# Patient Record
Sex: Female | Born: 1990 | Race: White | Hispanic: No | Marital: Single | Smoking: Current some day smoker
Health system: Southern US, Community
[De-identification: ages and names within clinical notes are randomized; demographics above are authoritative.]

## PROBLEM LIST (undated history)

## (undated) DIAGNOSIS — Z789 Other specified health status: Secondary | ICD-10-CM

## (undated) DIAGNOSIS — M5126 Other intervertebral disc displacement, lumbar region: Secondary | ICD-10-CM

## (undated) HISTORY — PX: NO PAST SURGERIES: SHX2092

---
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
## 2012-04-02 ENCOUNTER — Ambulatory Visit: Payer: BC Managed Care – HMO | Admitting: Family Medicine

## 2017-12-26 ENCOUNTER — Other Ambulatory Visit: Payer: Self-pay

## 2017-12-26 ENCOUNTER — Emergency Department (INDEPENDENT_AMBULATORY_CARE_PROVIDER_SITE_OTHER)
Admission: EM | Admit: 2017-12-26 | Discharge: 2017-12-26 | Disposition: A | Discharge status: Home / Self Care | Payer: BLUE CROSS/BLUE SHIELD | Source: Home / Self Care | Attending: Family Medicine | Admitting: Family Medicine

## 2017-12-26 DIAGNOSIS — R69 Illness, unspecified: Secondary | ICD-10-CM

## 2017-12-26 DIAGNOSIS — J111 Influenza due to unidentified influenza virus with other respiratory manifestations: Secondary | ICD-10-CM

## 2017-12-26 MED ORDER — AZITHROMYCIN 250 MG PO TABS: ORAL_TABLET | ORAL | 0 refills | Status: DC

## 2017-12-26 MED ORDER — BENZONATATE 200 MG PO CAPS: ORAL_CAPSULE | ORAL | 0 refills | Status: DC

## 2017-12-26 NOTE — Discharge Instructions (Addendum)
Take plain guaifenesin (1200mg  extended release tabs such as Mucinex) twice daily, with plenty of water, for cough and congestion.    Try warm salt water gargles for sore throat.  Stop all antihistamines for now, and other non-prescription cough/cold preparations. May take Ibuprofen 200mg , 4 tabs every 8 hours with food for chest/sternum discomfort, body aches, fever, etc. If needed, may take Delsym Cough Suppressant with Tessalon at bedtime for nighttime cough.

## 2017-12-26 NOTE — ED Provider Notes (Signed)
[facility]    CSN: [id number] Arrival date & time: 12/26/17  1659     History   Chief Complaint Chief Complaint  Patient presents with  . Chills    Flu like sxs    HPI Heidi Lozano is a 27 y.o. female.   Complains of 5 day history flu-like illness including myalgias, headache, fever/chills, fatigue, and cough.  She denies nasal congestion and sore throat.  Cough is non-productive and worse at night.  She complains of pleuritic pain in her anterior/inferior ribs and mild shortness of breath.  She has not had a flu shot this season.    The history is provided by the patient.    History reviewed. No pertinent past medical history.  There are no active problems to display for this patient.   History reviewed. No pertinent surgical history.  OB History   None      Home Medications    Prior to Admission medications   Medication Sig Start Date End Date Taking? Authorizing Provider  azithromycin (ZITHROMAX Z-PAK) 250 MG tablet Take 2 tabs today; then begin one tab once daily for 4 more days. 12/26/17   Lattie HawBeese,  A, MD  benzonatate (TESSALON) 200 MG capsule Take one cap by mouth at bedtime as needed for cough.  May repeat in 4 to 6 hours 12/26/17   Lattie HawBeese,  A, MD    Family History History reviewed. No pertinent family history.  Social History Social History   Tobacco Use  . Smoking status: Never Smoker  . Smokeless tobacco: Never Used  Substance Use Topics  . Alcohol use: Never    Frequency: Never  . Drug use: Never     Allergies   Patient has no known allergies.   Review of Systems Review of Systems No sore throat + cough + pleuritic pain anterior/inferior chest No wheezing No nasal congestion No post-nasal drainage No sinus pain/pressure No itchy/red eyes No earache No hemoptysis + SOB + fever, + chills No nausea No vomiting No abdominal pain No diarrhea No urinary symptoms No skin rash + fatigue + myalgias +  headache Used OTC meds without relief  Physical Exam Triage Vital Signs ED Triage Vitals [12/26/17 1717]  Enc Vitals Group     BP 109/71     Pulse Rate 86     Resp      Temp 98.5 F (36.9 C)     Temp Source Oral     SpO2 98 %     Weight 121 lb (54.9 kg)     Height 5\' 5"  (1.651 m)     Head Circumference      Peak Flow      Pain Score 0     Pain Loc      Pain Edu?      Excl. in GC?    No data found.  Updated Vital Signs BP 109/71 (BP Location: Right Arm)   Pulse 86   Temp 98.5 F (36.9 C) (Oral)   Ht 5\' 5"  (1.651 m)   Wt 54.9 kg   SpO2 98%   BMI 20.14 kg/m   Visual Acuity Right Eye Distance:   Left Eye Distance:   Bilateral Distance:    Right Eye Near:   Left Eye Near:    Bilateral Near:     Physical Exam Nursing notes and Vital Signs reviewed. Appearance:  Patient appears stated age, and in no acute distress Eyes:  Pupils are equal, round, and reactive to  light and accomodation.  Extraocular movement is intact.  Conjunctivae are not inflamed  Ears:  Canals normal.  Tympanic membranes normal.  Nose:  Mildly congested turbinates.  No sinus tenderness.  Pharynx:  Normal Neck:  Supple.  Enlarged posterior/lateral nodes are palpated bilaterally, tender to palpation on the left.   Lungs:  Clear to auscultation.  Breath sounds are equal.  Moving air well. Heart:  Regular rate and rhythm without murmurs, rubs, or gallops.  Abdomen:  Nontender without masses or hepatosplenomegaly.  Bowel sounds are present.  No CVA or flank tenderness.  Extremities:  No edema.  Skin:  No rash present.    UC Treatments / Results  Labs (all labs ordered are listed, but only abnormal results are displayed) Labs Reviewed - No data to display  EKG None  Radiology No results found.  Procedures Procedures (including critical care time)  Medications Ordered in UC Medications - No data to display  Initial Impression / Assessment and Plan / UC Course  I have reviewed the triage  vital signs and the nursing notes.  Pertinent labs & imaging results that were available during my care of the patient were reviewed by me and considered in my medical decision making (see chart for details).    Suspect influenza.  Patient declines chest X-ray (has no insurance) ?pneumonia.  Begin empiric Z-pak.  Prescription written for Benzonatate [facility] to take at bedtime for night-time cough.  Followup with Family Doctor if not improved in about 6 days. Recommend a flu shot when well.    Final Clinical Impressions(s) / UC Diagnoses   Final diagnoses:  Influenza-like illness     Discharge Instructions     Take plain guaifenesin (1200mg  extended release tabs such as Mucinex) twice daily, with plenty of water, for cough and congestion.    Try warm salt water gargles for sore throat.  Stop all antihistamines for now, and other non-prescription cough/cold preparations. May take Ibuprofen 200mg , 4 tabs every 8 hours with food for chest/sternum discomfort, body aches, fever, etc. If needed, may take Delsym Cough Suppressant with Tessalon at bedtime for nighttime cough.       ED Prescriptions    Medication Sig Dispense Auth. Provider   azithromycin (ZITHROMAX Z-PAK) 250 MG tablet Take 2 tabs today; then begin one tab once daily for 4 more days. 6 tablet Lattie Haw, MD   benzonatate (TESSALON) 200 MG capsule Take one cap by mouth at bedtime as needed for cough.  May repeat in 4 to 6 hours 15 capsule Cathren Harsh Tera Mater, MD         Lattie Haw, MD 12/26/17 [phone]

## 2017-12-26 NOTE — ED Triage Notes (Signed)
Pt c/o flu like sxs since Tuesday. Hard to take full breath. Had fever previously, some dizziness. Denies congestion. Taking ibuprofen and robitussin prn.

## 2021-01-20 NOTE — L&D Delivery Note (Signed)
Delivery Note   Pt pushed well for a little under an hour and at 10:08 AM a healthy female was delivered via Vaginal, Spontaneous (Presentation: Left Occiput Anterior).  APGAR: 8, 9; weight  .   Placenta status: Spontaneous, Intact.  Cord: 3 vessels with the following complications: None.    Anesthesia: Epidural Episiotomy: None Lacerations: 1st degree Suture Repair: 3.0 vicryl rapide Est. Blood Loss (mL):  Mom to postpartum.  Baby to Couplet care / Skin to Skin.  D/w parents circumcision and they desire  Oliver Pila 09/16/2021, 10:44 AM

## 2021-01-23 ENCOUNTER — Encounter: Payer: Self-pay | Admitting: Emergency Medicine

## 2021-01-23 ENCOUNTER — Emergency Department: Payer: No Typology Code available for payment source

## 2021-01-23 ENCOUNTER — Emergency Department
Admission: EM | Admit: 2021-01-23 | Discharge: 2021-01-24 | Disposition: A | Discharge status: Home / Self Care | Payer: No Typology Code available for payment source | Attending: Emergency Medicine | Admitting: Emergency Medicine

## 2021-01-23 ENCOUNTER — Other Ambulatory Visit: Payer: Self-pay

## 2021-01-23 DIAGNOSIS — O200 Threatened abortion: Secondary | ICD-10-CM | POA: Insufficient documentation

## 2021-01-23 DIAGNOSIS — O9A211 Injury, poisoning and certain other consequences of external causes complicating pregnancy, first trimester: Secondary | ICD-10-CM | POA: Diagnosis not present

## 2021-01-23 DIAGNOSIS — Y92410 Unspecified street and highway as the place of occurrence of the external cause: Secondary | ICD-10-CM | POA: Insufficient documentation

## 2021-01-23 DIAGNOSIS — S060X1A Concussion with loss of consciousness of 30 minutes or less, initial encounter: Secondary | ICD-10-CM | POA: Diagnosis not present

## 2021-01-23 DIAGNOSIS — O039 Complete or unspecified spontaneous abortion without complication: Secondary | ICD-10-CM | POA: Diagnosis not present

## 2021-01-23 DIAGNOSIS — T1490XA Injury, unspecified, initial encounter: Secondary | ICD-10-CM

## 2021-01-23 DIAGNOSIS — R1030 Lower abdominal pain, unspecified: Secondary | ICD-10-CM

## 2021-01-23 DIAGNOSIS — S0990XA Unspecified injury of head, initial encounter: Secondary | ICD-10-CM | POA: Diagnosis present

## 2021-01-23 DIAGNOSIS — O26891 Other specified pregnancy related conditions, first trimester: Secondary | ICD-10-CM | POA: Diagnosis not present

## 2021-01-23 LAB — CBC WITH DIFFERENTIAL/PLATELET
Abs Immature Granulocytes: 0.06 10*3/uL (ref 0.00–0.07)
Basophils Absolute: 0.1 10*3/uL (ref 0.0–0.1)
Basophils Relative: 1 %
Eosinophils Absolute: 0 10*3/uL (ref 0.0–0.5)
Eosinophils Relative: 0 %
HCT: 40.4 % (ref 36.0–46.0)
Hemoglobin: 13.7 g/dL (ref 12.0–15.0)
Immature Granulocytes: 1 %
Lymphocytes Relative: 19 %
Lymphs Abs: 2.1 10*3/uL (ref 0.7–4.0)
MCH: 29 pg (ref 26.0–34.0)
MCHC: 33.9 g/dL (ref 30.0–36.0)
MCV: 85.4 fL (ref 80.0–100.0)
Monocytes Absolute: 0.6 10*3/uL (ref 0.1–1.0)
Monocytes Relative: 6 %
Neutro Abs: 8.1 10*3/uL — ABNORMAL HIGH (ref 1.7–7.7)
Neutrophils Relative %: 73 %
Platelets: 398 10*3/uL (ref 150–400)
RBC: 4.73 MIL/uL (ref 3.87–5.11)
RDW: 12.3 % (ref 11.5–15.5)
WBC: 11 10*3/uL — ABNORMAL HIGH (ref 4.0–10.5)
nRBC: 0 % (ref 0.0–0.2)

## 2021-01-23 LAB — BASIC METABOLIC PANEL
Anion gap: 9 (ref 5–15)
BUN: 17 mg/dL (ref 6–20)
CO2: 23 mmol/L (ref 22–32)
Calcium: 9.2 mg/dL (ref 8.9–10.3)
Chloride: 103 mmol/L (ref 98–111)
Creatinine, Ser: 0.71 mg/dL (ref 0.44–1.00)
GFR, Estimated: >60 mL/min (ref >60)
Glucose, Bld: 100 mg/dL — ABNORMAL HIGH (ref 70–99)
Potassium: 3.6 mmol/L (ref 3.5–5.1)
Sodium: 135 mmol/L (ref 135–145)

## 2021-01-23 LAB — HCG, QUANTITATIVE, PREGNANCY: hCG, Beta Chain, Quant, S: 14911 m[IU]/mL — ABNORMAL HIGH (ref <5)

## 2021-01-23 NOTE — Discharge Instructions (Addendum)
Follow-up with your OB/GYN as planned.  You will need a repeat ultrasound in 7-10 days to recheck the pregnancy.  Return to the ER for new, worsening, or persistent severe abdominal pain or cramping, new or worsening vaginal bleeding, worsening back pain, weakness or numbness in the legs, difficulty walking, severe headache, or any other new or worsening symptoms that concern you.
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
## 2021-04-05 DIAGNOSIS — Z124 Encounter for screening for malignant neoplasm of cervix: Secondary | ICD-10-CM | POA: Diagnosis not present

## 2021-04-05 DIAGNOSIS — Z3480 Encounter for supervision of other normal pregnancy, unspecified trimester: Secondary | ICD-10-CM | POA: Diagnosis not present

## 2021-04-05 LAB — HEPATITIS C ANTIBODY: HCV Ab: NEGATIVE

## 2021-04-05 LAB — OB RESULTS CONSOLE HEPATITIS B SURFACE ANTIGEN: Hepatitis B Surface Ag: NEGATIVE

## 2021-04-05 LAB — OB RESULTS CONSOLE RUBELLA ANTIBODY, IGM: Rubella: IMMUNE

## 2021-04-05 LAB — OB RESULTS CONSOLE GC/CHLAMYDIA
Chlamydia: NEGATIVE
Neisseria Gonorrhea: NEGATIVE

## 2021-04-05 LAB — OB RESULTS CONSOLE RPR: RPR: NONREACTIVE

## 2021-04-05 LAB — OB RESULTS CONSOLE HIV ANTIBODY (ROUTINE TESTING): HIV: NONREACTIVE

## 2021-04-10 LAB — HM PAP SMEAR: HM Pap smear: NEGATIVE

## 2021-04-20 DIAGNOSIS — Z419 Encounter for procedure for purposes other than remedying health state, unspecified: Secondary | ICD-10-CM | POA: Diagnosis not present

## 2021-04-22 DIAGNOSIS — M545 Low back pain, unspecified: Secondary | ICD-10-CM | POA: Diagnosis not present

## 2021-04-23 DIAGNOSIS — M5451 Vertebrogenic low back pain: Secondary | ICD-10-CM | POA: Diagnosis not present

## 2021-04-23 DIAGNOSIS — Z3A18 18 weeks gestation of pregnancy: Secondary | ICD-10-CM | POA: Diagnosis not present

## 2021-04-23 DIAGNOSIS — Z363 Encounter for antenatal screening for malformations: Secondary | ICD-10-CM | POA: Diagnosis not present

## 2021-05-13 DIAGNOSIS — M5416 Radiculopathy, lumbar region: Secondary | ICD-10-CM | POA: Diagnosis not present

## 2021-05-20 DIAGNOSIS — Z419 Encounter for procedure for purposes other than remedying health state, unspecified: Secondary | ICD-10-CM | POA: Diagnosis not present

## 2021-05-22 DIAGNOSIS — Z369 Encounter for antenatal screening, unspecified: Secondary | ICD-10-CM | POA: Diagnosis not present

## 2021-05-22 DIAGNOSIS — Z3A22 22 weeks gestation of pregnancy: Secondary | ICD-10-CM | POA: Diagnosis not present

## 2021-06-11 DIAGNOSIS — M5416 Radiculopathy, lumbar region: Secondary | ICD-10-CM | POA: Diagnosis not present

## 2021-06-19 DIAGNOSIS — Z3480 Encounter for supervision of other normal pregnancy, unspecified trimester: Secondary | ICD-10-CM | POA: Diagnosis not present

## 2021-06-20 DIAGNOSIS — Z419 Encounter for procedure for purposes other than remedying health state, unspecified: Secondary | ICD-10-CM | POA: Diagnosis not present

## 2021-06-24 ENCOUNTER — Inpatient Hospital Stay (HOSPITAL_COMMUNITY)
Admission: AD | Admit: 2021-06-24 | Discharge: 2021-06-24 | Disposition: A | Discharge status: Home / Self Care | Payer: Medicaid Other | Attending: Obstetrics and Gynecology | Admitting: Obstetrics and Gynecology

## 2021-06-24 ENCOUNTER — Encounter (HOSPITAL_COMMUNITY): Payer: Self-pay | Admitting: *Deleted

## 2021-06-24 DIAGNOSIS — Z8659 Personal history of other mental and behavioral disorders: Secondary | ICD-10-CM | POA: Diagnosis not present

## 2021-06-24 DIAGNOSIS — Z3A27 27 weeks gestation of pregnancy: Secondary | ICD-10-CM | POA: Insufficient documentation

## 2021-06-24 DIAGNOSIS — O99342 Other mental disorders complicating pregnancy, second trimester: Secondary | ICD-10-CM | POA: Insufficient documentation

## 2021-06-24 DIAGNOSIS — O99891 Other specified diseases and conditions complicating pregnancy: Secondary | ICD-10-CM | POA: Diagnosis not present

## 2021-06-24 DIAGNOSIS — R102 Pelvic and perineal pain: Secondary | ICD-10-CM | POA: Insufficient documentation

## 2021-06-24 DIAGNOSIS — R109 Unspecified abdominal pain: Secondary | ICD-10-CM | POA: Diagnosis not present

## 2021-06-24 DIAGNOSIS — F410 Panic disorder [episodic paroxysmal anxiety] without agoraphobia: Secondary | ICD-10-CM | POA: Insufficient documentation

## 2021-06-24 DIAGNOSIS — O26899 Other specified pregnancy related conditions, unspecified trimester: Secondary | ICD-10-CM

## 2021-06-24 DIAGNOSIS — N949 Unspecified condition associated with female genital organs and menstrual cycle: Secondary | ICD-10-CM

## 2021-06-24 HISTORY — DX: Other specified health status: Z78.9

## 2021-06-24 LAB — WET PREP, GENITAL
Clue Cells Wet Prep HPF POC: NONE SEEN
Sperm: NONE SEEN
Trich, Wet Prep: NONE SEEN
WBC, Wet Prep HPF POC: <10 (ref <10)
Yeast Wet Prep HPF POC: NONE SEEN

## 2021-06-24 LAB — URINALYSIS, ROUTINE W REFLEX MICROSCOPIC
Bilirubin Urine: NEGATIVE
Glucose, UA: NEGATIVE mg/dL
Hgb urine dipstick: NEGATIVE
Ketones, ur: NEGATIVE mg/dL
Leukocytes,Ua: NEGATIVE
Nitrite: NEGATIVE
Protein, ur: NEGATIVE mg/dL
Specific Gravity, Urine: 1.021 (ref 1.005–1.030)
pH: 7 (ref 5.0–8.0)

## 2021-06-24 LAB — POCT FERN TEST: POCT Fern Test: NEGATIVE

## 2021-06-24 MED ORDER — ACETAMINOPHEN 500 MG PO TABS
1000.0000 mg | ORAL_TABLET | Freq: Once | ORAL | Status: AC
Start: 2021-06-24 — End: 2021-06-24
  Administered 2021-06-24: 1000 mg via ORAL
  Filled 2021-06-24: qty 2

## 2021-06-24 MED ORDER — CYCLOBENZAPRINE HCL 5 MG PO TABS: 5.0000 mg | ORAL_TABLET | Freq: Three times a day (TID) | ORAL | 0 refills | Status: DC | PRN

## 2021-06-24 NOTE — Discharge Instructions (Signed)

## 2021-06-24 NOTE — MAU Provider Note (Signed)
History     CSN: [id number]  Arrival date and time: 06/24/21 1112   Event Date/Time   First Provider Initiated Contact with Patient 06/24/21 1210      Chief Complaint  Patient presents with   Abdominal Pain   Back Pain   Numbness   Heidi Lozano 31 y.o. [id number] at [id number] presents to MAU with complaints of new onset dizziness, and numbness in her fingers accompanied by pain in stomach and back that started around 10am. Patient describes the pain as "Sharp back pain, that is consistent."  She currently rates her pain 6/10. She denies vaginal bleeding and leaking of fluid. Attempted stretching with no relief. Patient states that she had some sunflower seeds and oatmeal this AM around 0600. Denies nausea and vomiting or difficulty with urination. Patient endorses a BM this morning. States shes "felt more wet" since yesterday but denies foul odor or color.  She also reports positive fetal movement. Patient states she has a history of panic attacks and anxiety but was never "clinically diagnosed."  Patient states that the numbness in her fingers and lips were usually a sign of her panic attacks, "but not like this."  Patient sister at bedside contributing to HPI.    OB History     Gravida  3   Para      Term      Preterm      AB  2   Living         SAB  2   IAB      Ectopic      Multiple      Live Births              Past Medical History:  Diagnosis Date   Medical history non-contributory     Past Surgical History:  Procedure Laterality Date   NO PAST SURGERIES      History reviewed. No pertinent family history.  Social History   Tobacco Use   Smoking status: Never   Smokeless tobacco: Never  Vaping Use   Vaping Use: Former  Substance Use Topics   Alcohol use: Never   Drug use: Never    Allergies:  Allergies  Allergen Reactions   Coconut Flavor     Medications Prior to Admission  Medication Sig Dispense Refill Last Dose   azithromycin  (ZITHROMAX Z-PAK) 250 MG tablet Take 2 tabs today; then begin one tab once daily for 4 more days. 6 tablet 0    benzonatate (TESSALON) 200 MG capsule Take one cap by mouth at bedtime as needed for cough.  May repeat in 4 to 6 hours 15 capsule 0     Review of Systems  Constitutional:  Negative for chills, fatigue and fever.  Respiratory:  Negative for chest tightness, shortness of breath and wheezing.   Gastrointestinal:  Positive for abdominal pain. Negative for constipation, diarrhea, nausea and vomiting.  Genitourinary:  Positive for pelvic pain. Negative for difficulty urinating, vaginal bleeding, vaginal discharge and vaginal pain.  Physical Exam   Blood pressure (!) 118/49, pulse 83, temperature 98.6 F (37 C), temperature source Oral, resp. rate 15, height 5\' 5"  (1.651 m), weight 86.8 kg, SpO2 100 %.  Physical Exam Vitals and nursing note reviewed. Exam conducted with a chaperone present.  Constitutional:      General: She is not in acute distress.    Appearance: She is not diaphoretic.  HENT:     Head: Normocephalic.  Cardiovascular:  Rate and Rhythm: Normal rate.  Pulmonary:     Effort: Pulmonary effort is normal. No respiratory distress.  Abdominal:     Palpations: Abdomen is soft.     Tenderness: There is no abdominal tenderness. There is no right CVA tenderness or left CVA tenderness.     Comments: Pregnant   Genitourinary:    Vagina: Vaginal discharge present.     Cervix: Normal.  Skin:    General: Skin is warm and dry.  Neurological:     Mental Status: She is alert and oriented to person, place, and time.  Psychiatric:        Mood and Affect: Mood is anxious.        Behavior: Behavior normal.    MAU Course  Procedures Orders Placed This Encounter  Procedures   Wet prep, genital    Standing Status:   Standing    Number of Occurrences:   1   Urinalysis, Routine w reflex microscopic Urine, Clean Catch    Standing Status:   Standing    Number of  Occurrences:   1   Fern Test    Standing Status:   Standing    Number of Occurrences:   1   Discharge patient    Order Specific Question:   Discharge disposition    Answer:   01-Home or Self Care [1]    Order Specific Question:   Discharge patient date    Answer:   06/24/2021   Results for orders placed or performed during the hospital encounter of 06/24/21 (from the past 24 hour(s))  Urinalysis, Routine w reflex microscopic Urine, Clean Catch     Status: Abnormal   Collection Time: 06/24/21 11:48 AM  Result Value Ref Range   Color, Urine YELLOW YELLOW   APPearance HAZY (A) CLEAR   Specific Gravity, Urine 1.021 1.005 - 1.030   pH 7.0 5.0 - 8.0   Glucose, UA NEGATIVE NEGATIVE mg/dL   Hgb urine dipstick NEGATIVE NEGATIVE   Bilirubin Urine NEGATIVE NEGATIVE   Ketones, ur NEGATIVE NEGATIVE mg/dL   Protein, ur NEGATIVE NEGATIVE mg/dL   Nitrite NEGATIVE NEGATIVE   Leukocytes,Ua NEGATIVE NEGATIVE  Wet prep, genital     Status: None   Collection Time: 06/24/21 12:32 PM   Specimen: PATH Cytology Cervicovaginal Ancillary Only  Result Value Ref Range   Yeast Wet Prep HPF POC NONE SEEN NONE SEEN   Trich, Wet Prep NONE SEEN NONE SEEN   Clue Cells Wet Prep HPF POC NONE SEEN NONE SEEN   WBC, Wet Prep HPF POC <10 <10   Sperm NONE SEEN   Fern Test     Status: None   Collection Time: 06/24/21  1:47 PM  Result Value Ref Range   POCT Fern Test Negative = intact amniotic membranes     Meds ordered this encounter  Medications   acetaminophen (TYLENOL) tablet 1,000 mg   cyclobenzaprine (FLEXERIL) 5 MG tablet    Sig: Take 1 tablet (5 mg total) by mouth 3 (three) times daily as needed for muscle spasms.    Dispense:  20 tablet    Refill:  0    Order Specific Question:   Supervising Provider    Answer:   Reva Bores [[id number]]   Dilation: Fingertip Effacement (%): Thick Cervical Position: Posterior Station: Ballotable Exam by:: Rhunette Croft CNM  MDM Fern negative. Wet prep and UA normal.   GC: Pending  Pain improved with PO tylenol and oral hydration.   FHR: appropriate  for gestational age.  140 bpm with moderate variability and accels present no decels noted. No contractions.  CNM offered flexeril to take once patient is at home. Patient accepted.  Assessment and Plan   1. Abdominal pain during pregnancy, antepartum   2. Round ligament pain   3. History of panic attacks   4. [redacted] weeks gestation of pregnancy   - Patient reassured of normalcy of anxiety surrounding previous loss and how it affects the mental state of this pregnancy.  - Discussed possibly talking with her OB about her anxiety and ways to manage it.  - Discussed sciatica and round ligament pain with patient and expectations.  - Encouraged the use of Tylenol as needed.  - PO flexeril sent to outpatient pharmacy.  - Patient discharged home in stable condition.  - Patient may return to MAU as needed.   Claudette Head, CNM  06/24/2021, 12:11 PM

## 2021-06-24 NOTE — MAU Note (Signed)
...  Heidi Lozano is a 31 y.o. at [redacted]w[redacted]d here in MAU reporting: Lower back, bilateral flank, and lower abdominal pain that began around 1000 this morning while at work. She reports the pain alternates between cramping and sharp. 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 Denies burning with urination. Denies VB or LOF. DFM since 0800.  Patient crying in triage stating, "I lost my last baby at 14 weeks I was so scared I lost him."  Ate oatmeal, sunflower seeds, and fruit for breakfast.   Pain score:  8/10 lower abdomen 8/10 lower back 8/10 flank  FHT: 138 external Lab orders placed from triage: UA

## 2021-06-25 LAB — GC/CHLAMYDIA PROBE AMP (~~LOC~~) NOT AT ARMC
Chlamydia: NEGATIVE
Comment: NEGATIVE
Comment: NORMAL
Neisseria Gonorrhea: NEGATIVE

## 2021-07-20 DIAGNOSIS — Z419 Encounter for procedure for purposes other than remedying health state, unspecified: Secondary | ICD-10-CM | POA: Diagnosis not present

## 2021-07-31 DIAGNOSIS — Z23 Encounter for immunization: Secondary | ICD-10-CM | POA: Diagnosis not present

## 2021-08-20 DIAGNOSIS — Z419 Encounter for procedure for purposes other than remedying health state, unspecified: Secondary | ICD-10-CM | POA: Diagnosis not present

## 2021-08-30 DIAGNOSIS — Z3480 Encounter for supervision of other normal pregnancy, unspecified trimester: Secondary | ICD-10-CM | POA: Diagnosis not present

## 2021-08-30 DIAGNOSIS — Z3400 Encounter for supervision of normal first pregnancy, unspecified trimester: Secondary | ICD-10-CM | POA: Diagnosis not present

## 2021-08-30 LAB — OB RESULTS CONSOLE GBS: GBS: NEGATIVE

## 2021-09-02 DIAGNOSIS — Z322 Encounter for childbirth instruction: Secondary | ICD-10-CM | POA: Diagnosis not present

## 2021-09-10 DIAGNOSIS — Z3400 Encounter for supervision of normal first pregnancy, unspecified trimester: Secondary | ICD-10-CM | POA: Diagnosis not present

## 2021-09-15 ENCOUNTER — Other Ambulatory Visit: Payer: Self-pay

## 2021-09-15 ENCOUNTER — Encounter (HOSPITAL_COMMUNITY): Payer: Self-pay | Admitting: Obstetrics and Gynecology

## 2021-09-15 ENCOUNTER — Inpatient Hospital Stay (HOSPITAL_COMMUNITY)
Admission: AD | Admit: 2021-09-15 | Discharge: 2021-09-18 | DRG: 807 | Disposition: A | Discharge status: Home / Self Care | Payer: Medicaid Other | Attending: Obstetrics and Gynecology | Admitting: Obstetrics and Gynecology

## 2021-09-15 DIAGNOSIS — O26893 Other specified pregnancy related conditions, third trimester: Secondary | ICD-10-CM | POA: Diagnosis not present

## 2021-09-15 DIAGNOSIS — Z3A38 38 weeks gestation of pregnancy: Principal | ICD-10-CM

## 2021-09-15 HISTORY — DX: Other intervertebral disc displacement, lumbar region: M51.26

## 2021-09-15 LAB — CBC
HCT: 38.1 % (ref 36.0–46.0)
Hemoglobin: 12.6 g/dL (ref 12.0–15.0)
MCH: 27.6 pg (ref 26.0–34.0)
MCHC: 33.1 g/dL (ref 30.0–36.0)
MCV: 83.6 fL (ref 80.0–100.0)
Platelets: 397 10*3/uL (ref 150–400)
RBC: 4.56 MIL/uL (ref 3.87–5.11)
RDW: 14.2 % (ref 11.5–15.5)
WBC: 13.2 10*3/uL — ABNORMAL HIGH (ref 4.0–10.5)
nRBC: 0 % (ref 0.0–0.2)

## 2021-09-15 LAB — TYPE AND SCREEN
ABO/RH(D): A POS
Antibody Screen: NEGATIVE

## 2021-09-15 LAB — POCT FERN TEST: POCT Fern Test: POSITIVE

## 2021-09-15 MED ORDER — OXYTOCIN-SODIUM CHLORIDE 30-0.9 UT/500ML-% IV SOLN
2.5000 [IU]/h | INTRAVENOUS | Status: DC
  Administered 2021-09-16: 2.5 [IU]/h via INTRAVENOUS

## 2021-09-15 MED ORDER — LACTATED RINGERS IV SOLN: 500.0000 mL | INTRAVENOUS | Status: DC | PRN

## 2021-09-15 MED ORDER — HYDROXYZINE HCL 50 MG PO TABS: 50.0000 mg | ORAL_TABLET | Freq: Four times a day (QID) | ORAL | Status: DC | PRN

## 2021-09-15 MED ORDER — OXYCODONE-ACETAMINOPHEN 5-325 MG PO TABS: 1.0000 | ORAL_TABLET | ORAL | Status: DC | PRN

## 2021-09-15 MED ORDER — TERBUTALINE SULFATE 1 MG/ML IJ SOLN: 0.2500 mg | Freq: Once | INTRAMUSCULAR | Status: DC | PRN

## 2021-09-15 MED ORDER — OXYCODONE-ACETAMINOPHEN 5-325 MG PO TABS: 2.0000 | ORAL_TABLET | ORAL | Status: DC | PRN

## 2021-09-15 MED ORDER — LIDOCAINE HCL (PF) 1 % IJ SOLN: 30.0000 mL | INTRAMUSCULAR | Status: DC | PRN

## 2021-09-15 MED ORDER — ONDANSETRON HCL 4 MG/2ML IJ SOLN: 4.0000 mg | Freq: Four times a day (QID) | INTRAMUSCULAR | Status: DC | PRN

## 2021-09-15 MED ORDER — ACETAMINOPHEN 325 MG PO TABS: 650.0000 mg | ORAL_TABLET | ORAL | Status: DC | PRN

## 2021-09-15 MED ORDER — SOD CITRATE-CITRIC ACID 500-334 MG/5ML PO SOLN: 30.0000 mL | ORAL | Status: DC | PRN

## 2021-09-15 MED ORDER — FENTANYL CITRATE (PF) 100 MCG/2ML IJ SOLN
50.0000 ug | INTRAMUSCULAR | Status: DC | PRN
  Administered 2021-09-16 (×2): 100 ug via INTRAVENOUS
  Administered 2021-09-16: 50 ug via INTRAVENOUS
  Administered 2021-09-16: 100 ug via INTRAVENOUS
  Filled 2021-09-15 (×4): qty 2

## 2021-09-15 MED ORDER — LACTATED RINGERS IV SOLN: INTRAVENOUS | Status: DC

## 2021-09-15 MED ORDER — OXYTOCIN-SODIUM CHLORIDE 30-0.9 UT/500ML-% IV SOLN
1.0000 m[IU]/min | INTRAVENOUS | Status: DC
  Administered 2021-09-16: 2 m[IU]/min via INTRAVENOUS
  Filled 2021-09-15: qty 500

## 2021-09-15 MED ORDER — OXYTOCIN BOLUS FROM INFUSION
333.0000 mL | Freq: Once | INTRAVENOUS | Status: AC
  Administered 2021-09-16: 333 mL via INTRAVENOUS
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
## 2021-09-15 NOTE — MAU Note (Signed)
Heidi Lozano is a 31 y.o. at [redacted]w[redacted]d here in MAU reporting: CTX and rupture of membranes. Pt states she felt pressure yesterday 8/26 around 2100. The pressure increased today and at 2000 today her water broke. Pt states the fluid was clear with no odor. No VB +FM.   Onset of complaint: 8/27 Pain score: 4/10 Vitals:   09/15/21 2158  BP: (!) 125/93  Pulse: 95  Resp: 14  Temp: 99.2 F (37.3 C)     FHT:140 Lab orders placed from triage:  Crist Fat

## 2021-09-16 ENCOUNTER — Encounter (HOSPITAL_COMMUNITY): Payer: Self-pay | Admitting: Anesthesiology

## 2021-09-16 ENCOUNTER — Encounter (HOSPITAL_COMMUNITY): Payer: Self-pay | Admitting: Obstetrics and Gynecology

## 2021-09-16 ENCOUNTER — Inpatient Hospital Stay (HOSPITAL_COMMUNITY): Payer: Medicaid Other | Admitting: Anesthesiology

## 2021-09-16 DIAGNOSIS — O99344 Other mental disorders complicating childbirth: Secondary | ICD-10-CM | POA: Diagnosis not present

## 2021-09-16 DIAGNOSIS — Z3A39 39 weeks gestation of pregnancy: Secondary | ICD-10-CM | POA: Diagnosis not present

## 2021-09-16 DIAGNOSIS — F419 Anxiety disorder, unspecified: Secondary | ICD-10-CM | POA: Diagnosis not present

## 2021-09-16 LAB — RPR: RPR Ser Ql: NONREACTIVE

## 2021-09-16 MED ORDER — DIPHENHYDRAMINE HCL 50 MG/ML IJ SOLN: 12.5000 mg | INTRAMUSCULAR | Status: DC | PRN

## 2021-09-16 MED ORDER — ONDANSETRON HCL 4 MG PO TABS: 4.0000 mg | ORAL_TABLET | ORAL | Status: DC | PRN

## 2021-09-16 MED ORDER — FENTANYL-BUPIVACAINE-NACL 0.5-0.125-0.9 MG/250ML-% EP SOLN
EPIDURAL | Status: AC
  Filled 2021-09-16: qty 250

## 2021-09-16 MED ORDER — WITCH HAZEL-GLYCERIN EX PADS: 1.0000 | MEDICATED_PAD | CUTANEOUS | Status: DC | PRN

## 2021-09-16 MED ORDER — ACETAMINOPHEN 325 MG PO TABS: 650.0000 mg | ORAL_TABLET | ORAL | Status: DC | PRN

## 2021-09-16 MED ORDER — BENZOCAINE-MENTHOL 20-0.5 % EX AERO
1.0000 | INHALATION_SPRAY | CUTANEOUS | Status: DC | PRN
  Administered 2021-09-16 – 2021-09-18 (×2): 1 via TOPICAL
  Filled 2021-09-16 (×2): qty 56

## 2021-09-16 MED ORDER — FENTANYL-BUPIVACAINE-NACL 0.5-0.125-0.9 MG/250ML-% EP SOLN
12.0000 mL/h | EPIDURAL | Status: DC | PRN
  Administered 2021-09-16: 12 mL/h via EPIDURAL

## 2021-09-16 MED ORDER — LIDOCAINE HCL (PF) 1 % IJ SOLN
INTRAMUSCULAR | Status: DC | PRN
  Administered 2021-09-16: 8 mL via EPIDURAL

## 2021-09-16 MED ORDER — DIPHENHYDRAMINE HCL 25 MG PO CAPS: 25.0000 mg | ORAL_CAPSULE | Freq: Four times a day (QID) | ORAL | Status: DC | PRN

## 2021-09-16 MED ORDER — PRENATAL MULTIVITAMIN CH
1.0000 | ORAL_TABLET | Freq: Every day | ORAL | Status: DC
  Administered 2021-09-16 – 2021-09-18 (×3): 1 via ORAL
  Filled 2021-09-16 (×3): qty 1

## 2021-09-16 MED ORDER — IBUPROFEN 600 MG PO TABS
600.0000 mg | ORAL_TABLET | Freq: Four times a day (QID) | ORAL | Status: DC
  Administered 2021-09-16 – 2021-09-18 (×9): 600 mg via ORAL
  Filled 2021-09-16 (×10): qty 1

## 2021-09-16 MED ORDER — ZOLPIDEM TARTRATE 5 MG PO TABS: 5.0000 mg | ORAL_TABLET | Freq: Every evening | ORAL | Status: DC | PRN

## 2021-09-16 MED ORDER — PHENYLEPHRINE 80 MCG/ML (10ML) SYRINGE FOR IV PUSH (FOR BLOOD PRESSURE SUPPORT)
80.0000 ug | PREFILLED_SYRINGE | INTRAVENOUS | Status: DC | PRN
  Filled 2021-09-16: qty 10

## 2021-09-16 MED ORDER — TETANUS-DIPHTH-ACELL PERTUSSIS 5-2.5-18.5 LF-MCG/0.5 IM SUSY: 0.5000 mL | PREFILLED_SYRINGE | Freq: Once | INTRAMUSCULAR | Status: DC

## 2021-09-16 MED ORDER — SIMETHICONE 80 MG PO CHEW: 80.0000 mg | CHEWABLE_TABLET | ORAL | Status: DC | PRN

## 2021-09-16 MED ORDER — COCONUT OIL OIL: 1.0000 | TOPICAL_OIL | Status: DC | PRN

## 2021-09-16 MED ORDER — EPHEDRINE 5 MG/ML INJ: 10.0000 mg | INTRAVENOUS | Status: DC | PRN

## 2021-09-16 MED ORDER — TERBUTALINE SULFATE 1 MG/ML IJ SOLN
0.2500 mg | Freq: Once | INTRAMUSCULAR | Status: DC | PRN
Start: 2021-09-16 — End: 2021-09-16

## 2021-09-16 MED ORDER — FENTANYL-BUPIVACAINE-NACL 0.5-0.125-0.9 MG/250ML-% EP SOLN: 12.0000 mL/h | EPIDURAL | Status: DC | PRN

## 2021-09-16 MED ORDER — PHENYLEPHRINE 80 MCG/ML (10ML) SYRINGE FOR IV PUSH (FOR BLOOD PRESSURE SUPPORT): 80.0000 ug | PREFILLED_SYRINGE | INTRAVENOUS | Status: DC | PRN

## 2021-09-16 MED ORDER — ONDANSETRON HCL 4 MG/2ML IJ SOLN: 4.0000 mg | INTRAMUSCULAR | Status: DC | PRN

## 2021-09-16 MED ORDER — OXYTOCIN-SODIUM CHLORIDE 30-0.9 UT/500ML-% IV SOLN: 1.0000 m[IU]/min | INTRAVENOUS | Status: DC

## 2021-09-16 MED ORDER — EPHEDRINE 5 MG/ML INJ
10.0000 mg | INTRAVENOUS | Status: DC | PRN
Start: 2021-09-16 — End: 2021-09-16

## 2021-09-16 MED ORDER — DIBUCAINE (PERIANAL) 1 % EX OINT: 1.0000 | TOPICAL_OINTMENT | CUTANEOUS | Status: DC | PRN

## 2021-09-16 MED ORDER — LACTATED RINGERS IV SOLN: 500.0000 mL | Freq: Once | INTRAVENOUS | Status: DC

## 2021-09-16 MED ORDER — SENNOSIDES-DOCUSATE SODIUM 8.6-50 MG PO TABS
2.0000 | ORAL_TABLET | ORAL | Status: DC
  Administered 2021-09-16: 2 via ORAL
  Filled 2021-09-16: qty 2

## 2021-09-16 NOTE — Progress Notes (Addendum)
Patient getting more comfortable s/p epidural. Notes occ vaginal pressure but nothing constant  BP (!) 153/68   Pulse 94   Temp 98.3 F (36.8 C) (Oral)   Resp 20   Ht 5\' 5"  (1.651 m)   Wt 96.6 kg   LMP  (LMP Unknown) Comment: pt shielded  SpO2 97%   BMI 35.43 kg/m C CE ant lip/0 Cat 1-2 tracing, mix of early and variable decels TOCO q1-10m, pitocin at 42mU/min  Reviewed feelings or constant pressure/urge to push; patient happy having gotten epidural. Anticipate SVD. Of note, last temp 99.49F oral. Pitocin had been turned down from 4 to 1 earlier for late decels after IV fentanyl

## 2021-09-16 NOTE — Anesthesia Postprocedure Evaluation (Signed)
Anesthesia Post Note  Patient: Heidi Lozano  Procedure(s) Performed: AN AD HOC LABOR EPIDURAL     Patient location during evaluation: Mother Baby Anesthesia Type: Epidural Level of consciousness: awake and alert and oriented Pain management: satisfactory to patient Vital Signs Assessment: post-procedure vital signs reviewed and stable Respiratory status: respiratory function stable Cardiovascular status: stable Postop Assessment: no headache, no backache, epidural receding, patient able to bend at knees, no signs of nausea or vomiting, adequate PO intake and able to ambulate Anesthetic complications: no   No notable events documented.  Last Vitals:  Vitals:   09/16/21 1358 09/16/21 1743  BP: 119/69 120/67  Pulse: 85 85  Resp: 16 18  Temp: 36.9 C 36.9 C  SpO2: 99% 100%    Last Pain:  Vitals:   09/16/21 1743  TempSrc: Oral  PainSc: 0-No pain   Pain Goal:                   [name]

## 2021-09-16 NOTE — Lactation Note (Signed)
This note was copied from a baby's chart. Lactation Consultation Note  Patient Name: Heidi Lozano YBFXO'V Date: 09/16/2021 Reason for consult: L&D Initial assessment Age:29 hours   P1: Term infant at 39+0 weeks Feeding preference: Breast  Birth parent eating lunch; offered to return later for an initial visit; family appreciative.  Answered a few questions but will have evening LC follow up for further teaching.   Maternal Data Does the patient have breastfeeding experience prior to this delivery?: No  Feeding Mother's Current Feeding Choice: Breast Milk  LATCH Score Latch: Repeated attempts needed to sustain latch, nipple held in mouth throughout feeding, stimulation needed to elicit sucking reflex.  Audible Swallowing: None  Type of Nipple: Everted at rest and after stimulation  Comfort (Breast/Nipple): Soft / non-tender  Hold (Positioning): Assistance needed to correctly position infant at breast and maintain latch.  LATCH Score: 6   Lactation Tools Discussed/Used    Interventions Interventions: Assisted with latch;Skin to skin;Education  Discharge    Consult Status Consult Status: Follow-up from L&D    Irene Pap Monee Dembeck 09/16/2021, 2:43 PM

## 2021-09-16 NOTE — Lactation Note (Signed)
This note was copied from a baby's chart. Lactation Consultation Note  Patient Name: Heidi Lozano Date: 09/16/2021 Reason for consult: L&D Initial assessment Age:30 hours  P1, Attempted to latch on both breasts.  Lactation will follow up on MBU.   Maternal Data Does the patient have breastfeeding experience prior to this delivery?: No  Feeding Mother's Current Feeding Choice: Breast Milk  LATCH Score Latch: Repeated attempts needed to sustain latch, nipple held in mouth throughout feeding, stimulation needed to elicit sucking reflex.  Audible Swallowing: None  Type of Nipple: Everted at rest and after stimulation  Comfort (Breast/Nipple): Soft / non-tender  Hold (Positioning): Assistance needed to correctly position infant at breast and maintain latch.  LATCH Score: 6   Interventions Interventions: Assisted with latch;Skin to skin;Education  Consult Status Consult Status: Follow-up from L&D    Dahlia Byes [facility] 09/16/2021, 10:55 AM

## 2021-09-16 NOTE — Lactation Note (Signed)
This note was copied from a baby's chart. Lactation Consultation Note  Patient Name: Heidi Lozano FYBOF'B Date: 09/16/2021 Reason for consult: Initial assessment;Mother's request;Difficult latch;Primapara;1st time breastfeeding;Term;Breastfeeding assistance Age:29 hours  Birth parent has nipple sensitivity prior to latching. With hand expression, colostrum noted.  Birth parent to pre pump prior to latching Plan 1. To feed based on cues 8-12x 24hr period. Birth parent to offer breasts and look for signs of milk transfer 2. If unable to latch, hand express or use manual pump for 10 min each side and offer colostrum via spoon 5-7 ml per feeding.  All questions answered at the end of the visit.   Maternal Data Has patient been taught Hand Expression?: Yes Does the patient have breastfeeding experience prior to this delivery?: No  Feeding Mother's Current Feeding Choice: Breast Milk  LATCH Score Latch: Repeated attempts needed to sustain latch, nipple held in mouth throughout feeding, stimulation needed to elicit sucking reflex.  Audible Swallowing: None  Type of Nipple: Flat  Comfort (Breast/Nipple): Soft / non-tender  Hold (Positioning): Assistance needed to correctly position infant at breast and maintain latch.  LATCH Score: 5   Lactation Tools Discussed/Used Tools: Pump;Flanges Flange Size: 21 Breast pump type: Manual Pump Education: Setup, frequency, and cleaning;Milk Storage Reason for Pumping: elongate nipple Pumping frequency: pre pump each side for 6x before latching  Interventions Interventions: Breast feeding basics reviewed;Assisted with latch;Skin to skin;Breast massage;Hand express;Pre-pump if needed;Breast compression;Adjust position;Support pillows;Position options;Expressed milk;Hand pump;Education;LC Psychologist, educational;Infant Driven Feeding Algorithm education  Discharge Pump: Manual  Consult Status Consult Status: Follow-up Date: 09/17/21 Follow-up  type: In-patient    Ulysess Witz  Nicholson-Springer 09/16/2021, 9:05 PM

## 2021-09-16 NOTE — Anesthesia Preprocedure Evaluation (Addendum)
Anesthesia Evaluation  Patient identified by MRN, date of birth, ID band Patient awake    Reviewed: Allergy & Precautions, H&P , NPO status , Patient's Chart, lab work & pertinent test results, reviewed documented beta blocker date and time   Airway Mallampati: II  TM Distance: >3 FB Neck ROM: full    Dental no notable dental hx.    Pulmonary neg pulmonary ROS,    Pulmonary exam normal breath sounds clear to auscultation       Cardiovascular negative cardio ROS Normal cardiovascular exam Rhythm:regular Rate:Normal     Neuro/Psych PSYCHIATRIC DISORDERS Anxiety negative neurological ROS     GI/Hepatic negative GI ROS, Neg liver ROS,   Endo/Other  negative endocrine ROS  Renal/GU negative Renal ROS  negative genitourinary   Musculoskeletal   Abdominal   Peds  Hematology negative hematology ROS (+)   Anesthesia Other Findings   Reproductive/Obstetrics (+) Pregnancy                            Anesthesia Physical Anesthesia Plan  ASA: 2  Anesthesia Plan: Epidural   Post-op Pain Management:    Induction:   PONV Risk Score and Plan: 2 and Treatment may vary due to age or medical condition  Airway Management Planned: Natural Airway  Additional Equipment: None  Intra-op Plan:   Post-operative Plan:   Informed Consent: I have reviewed the patients History and Physical, chart, labs and discussed the procedure including the risks, benefits and alternatives for the proposed anesthesia with the patient or authorized representative who has indicated his/her understanding and acceptance.       Plan Discussed with:   Anesthesia Plan Comments:         Anesthesia Quick Evaluation

## 2021-09-16 NOTE — Anesthesia Procedure Notes (Signed)
Epidural Patient location during procedure: OB Start time: 09/16/2021 5:27 AM End time: 09/16/2021 5:32 AM  Staffing Anesthesiologist: Bethena Midget, MD  Preanesthetic Checklist Completed: patient identified, IV checked, site marked, risks and benefits discussed, surgical consent, monitors and equipment checked, pre-op evaluation and timeout performed  Epidural Patient position: sitting Prep: DuraPrep and site prepped and draped Patient monitoring: continuous pulse ox and blood pressure Approach: midline Location: L3-L4 Injection technique: LOR air  Needle:  Needle type: Tuohy  Needle gauge: 17 G Needle length: 9 cm and 9 Needle insertion depth: 6 cm Catheter type: closed end flexible Catheter size: 19 Gauge Catheter at skin depth: 12 cm Test dose: negative  Assessment Events: blood not aspirated, injection not painful, no injection resistance, no paresthesia and negative IV test

## 2021-09-16 NOTE — Progress Notes (Signed)
Patient ID: Heidi Lozano, female   DOB: Jun 27, 1990, 30 y.o.   MRN: [id number] Pt denies feeling any pressure or urge to push  FHR category 1  Temp 98.5   Cervix c/c/+2 Practice push attempted but no real movement felt. Will labor down a bit more and see if begins to feel some pressure Recheck in next hour.

## 2021-09-16 NOTE — Progress Notes (Signed)
Continued regular leakage, clear BP 125/81   Pulse 86   Temp 98.3 F (36.8 C) (Oral)   Resp 20   Ht 5\' 5"  (1.651 m)   Wt 96.6 kg   LMP  (LMP Unknown) Comment: pt shielded  BMI 35.43 kg/m  CE  3/90/0 Cat 1 tracing, early decels [id number]  Augment with pitocin at this time, IV pain meds PRN

## 2021-09-17 LAB — CBC
HCT: 34.3 % — ABNORMAL LOW (ref 36.0–46.0)
Hemoglobin: 11.5 g/dL — ABNORMAL LOW (ref 12.0–15.0)
MCH: 28.5 pg (ref 26.0–34.0)
MCHC: 33.5 g/dL (ref 30.0–36.0)
MCV: 85.1 fL (ref 80.0–100.0)
Platelets: 276 10*3/uL (ref 150–400)
RBC: 4.03 MIL/uL (ref 3.87–5.11)
RDW: 14.3 % (ref 11.5–15.5)
WBC: 11.9 10*3/uL — ABNORMAL HIGH (ref 4.0–10.5)
nRBC: 0 % (ref 0.0–0.2)

## 2021-09-17 NOTE — Lactation Note (Signed)
This note was copied from a baby's chart. Lactation Consultation Note  Patient Name: Heidi Lozano XBWIO'M Date: 09/17/2021 Reason for consult: Follow-up assessment;Primapara;Term;1st time breastfeeding Age:29 hours  P1: Term infant at 39+0 weeks Feeding preference: Breast Weight loss: 5% < 24 hours  Visited with family and offered to awaken "[name]" and assist with breast feeding.  Per parent's he has been spitty and has not latched well.  Birth parent has been expressing colostrum and spoon feeding volume.  Pediatrician in room at the same time as my visit; assessed a short lingual frenulum which I also can verify.  "[name]" also has a small, narrow mouth.  Stressed the importance of making sure he opens very wide prior to latching.  Explained how to accomplish this.    Assisted to latch fairly easily, however, he was not interested in initiating a suck.  With gentle stimulation he became frustrated and pushed back off the breast.  Repeated attempts without success.  Birth parent pumped 8 mls prior to my arrival and had another 23 mls of expressed milk in the refrigerator.  Due to weight loss of 5% in < 24 hours I suggested feeding back any amount she obtains immediately. Demonstrated paced bottle feeding using the yellow slow flow nipple and he easily consumed the entire 21 mls.  Suggested birth parent latch at least every three hours or sooner if he shows cues.  Support person will supplement with her pumped volume after breast feeding and birth parent will continue to pump every three hours.  Praised birth parent for pumping, explained that her volume amount was very good.  Encouraged to call her RN/LC for assistance as needed.  Support person doing STS when I left the room.  RN updated.    Maternal Data Has patient been taught Hand Expression?: Yes Does the patient have breastfeeding experience prior to this delivery?: No  Feeding Mother's Current Feeding Choice: Breast Milk Nipple  Type: Slow - flow  LATCH Score Latch: Too sleepy or reluctant, no latch achieved, no sucking elicited.  Audible Swallowing: None  Type of Nipple: Everted at rest and after stimulation  Comfort (Breast/Nipple): Soft / non-tender  Hold (Positioning): Assistance needed to correctly position infant at breast and maintain latch.  LATCH Score: 5   Lactation Tools Discussed/Used Tools: Pump;Flanges Flange Size: 21 Breast pump type: Double-Electric Breast Pump;Manual Pump Education: Setup, frequency, and cleaning;Milk Storage Reason for Pumping: Breast stimulation for supplementation Pumping frequency: Every three hours  Interventions    Discharge Pump: Personal  Consult Status Consult Status: Follow-up Date: 09/18/21 Follow-up type: In-patient    Darsha Zumstein R Quade Ramirez 09/17/2021, 1:26 PM

## 2021-09-17 NOTE — Progress Notes (Signed)
Post Partum Day 1 Subjective: no complaints, up ad lib, and voiding  Objective: Blood pressure 117/67, pulse 80, temperature 98.3 F (36.8 C), temperature source Oral, resp. rate 18, height 5\' 5"  (1.651 m), weight 96.6 kg, SpO2 99 %, unknown if currently breastfeeding.  Physical Exam:  General: alert, cooperative, and appears stated age Lochia: appropriate Uterine Fundus: firm DVT Evaluation: No evidence of DVT seen on physical exam.  Recent Labs    09/15/21 2209 09/17/21 0550  HGB 12.6 11.5*  HCT 38.1 34.3*    Assessment/Plan: Plan for discharge tomorrow Breastfeeding Desires neonatal circumcision, R/B/A of procedure discussed at length. Pt understands that neonatal circumcision is not considered medically necessary and is elective. The risks include, but are not limited to bleeding, infection, damage to the penis, development of scar tissue, and having to have it redone at a later date. Pt understands theses risks and wishes to proceed. Baby has yet to urinate, postpone circ until tomorrow    LOS: 2 days   09/19/21, MD 09/17/2021, 12:39 PM

## 2021-09-18 MED ORDER — IBUPROFEN 600 MG PO TABS
600.0000 mg | ORAL_TABLET | Freq: Four times a day (QID) | ORAL | 0 refills | Status: AC
Start: 2021-09-18 — End: ?

## 2021-09-18 NOTE — Discharge Instructions (Signed)
As per discharge pamphlet °

## 2021-09-18 NOTE — Social Work (Signed)
CSW received consult for hx of Anxiety and Depression.  CSW met with MOB to offer support and complete assessment. CSW entered the room, introduced self, CSW role and reason for visit. MOB agreeable to visit. CSW observed MOB sitting up in bed and the infant in the bassinet. MOB was pleasant and inviting. CSW inquired about how MOB was feeling. MOB reported she was doing well. CSW inquired about MOB's MH hx. MOB reports she was never "officially diagnosed" with anxiety but has experienced it since she was a teenager.  MOB reported she feels it may have been a misdiagnoses because she is now in the process of being diagnosed with Autism. MOB reported she has never had treatment for her anxiety and she has self managed. MOB reported a stable mood now and throughout her whole pregnancy. CSW assessed for safety, MOB denied any SI, HI or DV. MOB reported she has enrolled in a postpartum support group and class to be proactive about her mental health. MOB also reported she has a postpartum Doula to check in with. CSW praised MOB for being proactive. CSW provided education regarding the baby blues period vs. perinatal mood disorders, discussed treatment and gave resources for mental health follow up if concerns arise.  CSW recommends self-evaluation during the postpartum time period using the New Mom Checklist from Postpartum Progress and encouraged MOB to contact a medical professional if symptoms are noted at any time.    CSW provided review of Sudden Infant Death Syndrome (SIDS) precautions. MOB identified [facility] for infants follow up care. MOB reported the have all necessary items for the infant including a bassinet for the infant to sleep.    CSW identifies no further need for intervention and no barriers to discharge at this time.  Delno Blaisdell, LCSWA Clinical Social Worker [phone] 

## 2021-09-18 NOTE — Discharge Summary (Signed)
Postpartum Discharge Summary      Patient Name: Heidi Lozano DOB: 1990-06-06 MRN: [id number]  Date of admission: 09/15/2021 Delivery date:09/16/2021  Delivering provider: Huel Cote  Date of discharge: 09/18/2021  Admitting diagnosis: [redacted] weeks gestation of pregnancy [Z3A.38] NSVD (normal spontaneous vaginal delivery) [O80] Intrauterine pregnancy: [redacted]w[redacted]d     Secondary diagnosis:  Principal Problem:   [redacted] weeks gestation of pregnancy Active Problems:   NSVD (normal spontaneous vaginal delivery)     Discharge diagnosis: Term Pregnancy Delivered                                               Hospital course: Onset of Labor With Vaginal Delivery      31 y.o. yo [id number] at [redacted]w[redacted]d was admitted in Latent Labor on 09/15/2021. Patient had an uncomplicated labor course as follows:  Membrane Rupture Time/Date: 8:00 PM ,09/16/2021   Delivery Method:Vaginal, Spontaneous  Episiotomy: None  Lacerations:  1st degree  Patient had an uncomplicated postpartum course.  She is ambulating, tolerating a regular diet, passing flatus, and urinating well. Patient is discharged home in stable condition on 09/18/21.  Newborn Data: Birth date:09/16/2021  Birth time:10:08 AM  Gender:Female  Living status:Living  Apgars:8 ,9  Weight:3710 g    Physical exam  Vitals:   09/17/21 0500 09/17/21 1210 09/17/21 2000 09/18/21 0619  BP: 117/67 122/66 118/73 118/73  Pulse: 80 79 92 78  Resp: 18 18 18 18   Temp: 98.3 F (36.8 C) 98 F (36.7 C) 98.2 F (36.8 C) 97.9 F (36.6 C)  TempSrc: Oral  Oral Oral  SpO2: 99% 98% 99% 100%  Weight:      Height:       General: alert Lochia: appropriate Uterine Fundus: firm  Labs: Lab Results  Component Value Date   WBC 11.9 (H) 09/17/2021   HGB 11.5 (L) 09/17/2021   HCT 34.3 (L) 09/17/2021   MCV 85.1 09/17/2021   PLT 276 09/17/2021      Latest Ref Rng & Units 01/23/2021    7:44 PM  CMP  Glucose 70 - 99 mg/dL 03/23/2021   BUN 6 - 20 mg/dL 17   Creatinine 381 -  1.00 mg/dL 0.17   Sodium 5.10 - 258 mmol/L 135   Potassium 3.5 - 5.1 mmol/L 3.6   Chloride 98 - 111 mmol/L 103   CO2 22 - 32 mmol/L 23   Calcium 8.9 - 10.3 mg/dL 9.2    Edinburgh Score:    09/18/2021    6:19 AM  Edinburgh Postnatal Depression Scale Screening Tool  I have been able to laugh and see the funny side of things. 0  I have looked forward with enjoyment to things. 0  I have blamed myself unnecessarily when things went wrong. 1  I have been anxious or worried for no good reason. 2  I have felt scared or panicky for no good reason. 0  Things have been getting on top of me. 0  I have been so unhappy that I have had difficulty sleeping. 0  I have felt sad or miserable. 0  I have been so unhappy that I have been crying. 0  The thought of harming myself has occurred to me. 0  Edinburgh Postnatal Depression Scale Total 3      After visit meds:  Allergies as of 09/18/2021  Reactions   Coconut Flavor Swelling, Other (See Comments)   Swelling of throat, skin irritation         Medication List     TAKE these medications    acetaminophen 325 MG tablet Commonly known as: TYLENOL Take 325 mg by mouth daily as needed (pain).   cyclobenzaprine 5 MG tablet Commonly known as: FLEXERIL Take 1 tablet (5 mg total) by mouth 3 (three) times daily as needed for muscle spasms.   FOLIC ACID PO Take 1 tablet by mouth daily.   ibuprofen 600 MG tablet Commonly known as: ADVIL Take 1 tablet (600 mg total) by mouth every 6 (six) hours.   prenatal multivitamin Tabs tablet Take 2 tablets by mouth daily at 12 noon.         Discharge home in stable condition Infant Feeding: Breast Infant Disposition:home with mother Discharge instruction: per After Visit Summary and Postpartum booklet. Activity: Advance as tolerated. Pelvic rest for 6 weeks.  Diet: routine diet Postpartum Appointment:4 weeks Follow up Visit:  Follow-up Information     Carrington Clamp, MD. Schedule  an appointment as soon as possible for a visit in 4 week(s).   Specialty: Obstetrics and Gynecology Contact information: [address] [place] [address] [phone]                     09/18/2021 Zenaida Niece, MD

## 2021-09-18 NOTE — Lactation Note (Signed)
This note was copied from a baby's chart. Lactation Consultation Note  Patient Name: Heidi Lozano BJSEG'B Date: 09/18/2021 Reason for consult: Follow-up assessment Age:29 hours   P1: Term infant at 39+0 weeks Feeding preference; Breast  Family has been discharged.  Visited with parents prior to discharge and reviewed feeding plan for home.  Answered all questions.  Birth parent feels like "[name]" is latching better; supplementing with formula until milk comes to volume and he is able to breast feed well.  Family has our op phone number for any further questions/concerns after discharge.  RN updated.   Maternal Data     Feeding    LATCH Score                    Lactation Tools Discussed/Used    Interventions    Discharge    Consult Status Consult Status: Complete Date: 09/18/21 Follow-up type: Call as needed    Irene Pap Harlow Basley 09/18/2021, 12:23 PM

## 2021-09-18 NOTE — Progress Notes (Signed)
PPD #2 Doing well Afeb, VSS D/c home 

## 2021-09-20 DIAGNOSIS — Z419 Encounter for procedure for purposes other than remedying health state, unspecified: Secondary | ICD-10-CM | POA: Diagnosis not present

## 2021-09-23 ENCOUNTER — Inpatient Hospital Stay (HOSPITAL_COMMUNITY): Admit: 2021-09-23 | Payer: Medicaid Other | Admitting: Obstetrics and Gynecology

## 2021-09-24 ENCOUNTER — Telehealth (HOSPITAL_COMMUNITY): Payer: Self-pay | Admitting: *Deleted

## 2021-09-24 NOTE — Telephone Encounter (Signed)
Mom reports feeling good. No concerns about herself at this time. EPDS=3 [facility] score=3) Mom reports baby is doing well. Feeding, peeing, and pooping without difficulty. Safe sleep reviewed. Mom reports no concerns about baby at present.  Duffy Rhody, RN 09-24-2021 at 1:49pm

## 2021-10-03 DIAGNOSIS — Z323 Encounter for childcare instruction: Secondary | ICD-10-CM | POA: Diagnosis not present

## 2021-10-04 DIAGNOSIS — R69 Illness, unspecified: Secondary | ICD-10-CM | POA: Diagnosis not present

## 2021-10-20 DIAGNOSIS — Z419 Encounter for procedure for purposes other than remedying health state, unspecified: Secondary | ICD-10-CM | POA: Diagnosis not present

## 2021-11-20 DIAGNOSIS — Z419 Encounter for procedure for purposes other than remedying health state, unspecified: Secondary | ICD-10-CM | POA: Diagnosis not present

## 2021-12-20 DIAGNOSIS — Z419 Encounter for procedure for purposes other than remedying health state, unspecified: Secondary | ICD-10-CM | POA: Diagnosis not present

## 2022-01-03 DIAGNOSIS — Z3483 Encounter for supervision of other normal pregnancy, third trimester: Secondary | ICD-10-CM | POA: Diagnosis not present

## 2022-01-03 DIAGNOSIS — Z3482 Encounter for supervision of other normal pregnancy, second trimester: Secondary | ICD-10-CM | POA: Diagnosis not present

## 2022-01-20 DIAGNOSIS — Z419 Encounter for procedure for purposes other than remedying health state, unspecified: Secondary | ICD-10-CM | POA: Diagnosis not present

## 2022-02-20 DIAGNOSIS — Z419 Encounter for procedure for purposes other than remedying health state, unspecified: Secondary | ICD-10-CM | POA: Diagnosis not present

## 2022-03-21 DIAGNOSIS — Z419 Encounter for procedure for purposes other than remedying health state, unspecified: Secondary | ICD-10-CM | POA: Diagnosis not present

## 2022-04-21 DIAGNOSIS — Z419 Encounter for procedure for purposes other than remedying health state, unspecified: Secondary | ICD-10-CM | POA: Diagnosis not present

## 2022-05-21 DIAGNOSIS — Z419 Encounter for procedure for purposes other than remedying health state, unspecified: Secondary | ICD-10-CM | POA: Diagnosis not present

## 2022-06-21 DIAGNOSIS — Z419 Encounter for procedure for purposes other than remedying health state, unspecified: Secondary | ICD-10-CM | POA: Diagnosis not present

## 2022-07-21 DIAGNOSIS — Z419 Encounter for procedure for purposes other than remedying health state, unspecified: Secondary | ICD-10-CM | POA: Diagnosis not present

## 2022-08-19 ENCOUNTER — Encounter: Payer: Self-pay | Admitting: Family Medicine

## 2022-08-19 ENCOUNTER — Ambulatory Visit (INDEPENDENT_AMBULATORY_CARE_PROVIDER_SITE_OTHER): Payer: Medicaid Other | Admitting: Family Medicine

## 2022-08-19 DIAGNOSIS — E669 Obesity, unspecified: Secondary | ICD-10-CM | POA: Diagnosis not present

## 2022-08-19 DIAGNOSIS — M129 Arthropathy, unspecified: Secondary | ICD-10-CM | POA: Diagnosis not present

## 2022-08-19 DIAGNOSIS — K529 Noninfective gastroenteritis and colitis, unspecified: Secondary | ICD-10-CM

## 2022-08-19 NOTE — Progress Notes (Unsigned)
New Patient Office Visit  Subjective    Patient ID: Heidi Lozano, female    DOB: 02/15/1990  Age: 32 y.o. MRN: [id number]  CC:  Chief Complaint  Patient presents with   New Patient (Initial Visit)    HPI Heidi Lozano presents to establish care Hasn't had a pcp in a while.   Has an OB/GYN and gets Pap smears from them.  Next visit is in August.  Joint pain-patient has a significant family history of rheumatoid arthritis.  Had a grandmother who required a potation of some of her digits due to rheumatoid arthritis.  Patient has been complaining of pain in between in the third and fourth digits of the right hand at the MCP joints.  Also feels like her fine motor skills with her fingers is worsening over the past month.  Has not noticed any swelling in her hands.  She feels like they are "clumsy".  Also complains of pain in the right fifth MTP joint of the foot.  This is also been ongoing for a few months.  No swelling in the area no erythema.  Is tender to palpation.  No family history of gout.  Diarrhea -patient has not had any normal bowel movements in the past year.  Has multiple episodes of diarrhea daily.  They are brown and sometimes light-colored.  No blood.  Symptoms for started about a month after her son was born.  She has tried different forms of food restriction or illumination including lactose, gluten.  All for at least 1 month.  No affect noted during these changes.  Has no specific foods that trigger it, has a bowel movement immediately after all meals.  No nausea or vomiting.  No abdominal pain    PMH: no  PSH: no  FH: mom - breast ca at 28 yo; father - facial tumor?; mother DM2.  Mother died from pneumonia.    Tobacco use: no Alcohol use: no Drug use: cbd gummies.   Marital status: in a relationship.  1 child. 28 year old boy: zane.   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   Sexual hx: sexually active.  Condoms.       Outpatient Encounter Medications as of 08/19/2022   Medication Sig   acetaminophen (TYLENOL) 325 MG tablet Take 325 mg by mouth daily as needed (pain).   ibuprofen (ADVIL) 600 MG tablet Take 1 tablet (600 mg total) by mouth every 6 (six) hours.   Prenatal Vit-Fe Fumarate-FA (PRENATAL MULTIVITAMIN) TABS tablet Take 2 tablets by mouth daily at 12 noon.   [DISCONTINUED] cyclobenzaprine (FLEXERIL) 5 MG tablet Take 1 tablet (5 mg total) by mouth 3 (three) times daily as needed for muscle spasms. (Patient not taking: Reported on 09/16/2021)   [DISCONTINUED] FOLIC ACID PO Take 1 tablet by mouth daily.   No facility-administered encounter medications on file as of 08/19/2022.    Past Medical History:  Diagnosis Date   Lumbar herniated disc    Medical history non-contributory     Past Surgical History:  Procedure Laterality Date   NO PAST SURGERIES      Family History  Problem Relation Age of Onset   Diabetes Mother     Social History   Socioeconomic History   Marital status: Single    Spouse name: Not on file   Number of children: Not on file   Years of education: Not on file   Highest education level: Not on file  Occupational History   Not  on file  Tobacco Use   Smoking status: Never   Smokeless tobacco: Never  Vaping Use   Vaping status: Former  Substance and Sexual Activity   Alcohol use: Never   Drug use: Never   Sexual activity: Yes  Other Topics Concern   Not on file  Social History Narrative   Not on file   Social Determinants of Health   Financial Resource Strain: Low Risk  (06/29/2017)   Received from [facility]   Overall Financial Resource Strain (CARDIA)    Difficulty of Paying Living Expenses: Not hard at all  Food Insecurity: No Food Insecurity (06/29/2017)   Received from [facility]   Hunger Vital Sign    Worried About Running Out of Food in the Last Year: Never true    Ran Out of Food in the Last Year: Never true  Transportation Needs: No Transportation Needs (06/29/2017)   Received from [facility] - Transportation    Lack of Transportation (Medical): No    Lack of Transportation (Non-Medical): No  Physical Activity: Insufficiently Active (06/29/2017)   Received from [facility]   Exercise Vital Sign    Days of Exercise per Week: 1 day    Minutes of Exercise per Session: 20 min  Stress: Stress Concern Present (06/29/2017)   Received from [facility] - Occupational Stress Questionnaire    Feeling of Stress : Rather much  Social Connections: Moderately Isolated (06/29/2017)   Received from [facility]   Social Connection and Isolation Panel [NHANES]    Frequency of Communication with Friends and Family: Three times a week    Frequency of Social Gatherings with Friends and Family: Twice a week    Attends Religious Services: Never    Database administrator or Organizations: No    Attends Banker Meetings: Never    Marital Status: Never married  Intimate Partner Violence: Not At Risk (06/29/2017)   Received from [facility], Afraid, Rape, and Kick questionnaire    Fear of Current or Ex-Partner: No    Emotionally Abused: No    Physically Abused: No    Sexually Abused: No    ROS      Objective    BP 110/74   Pulse 70   Ht 5\' 5"  (1.651 m)   Wt 195 lb 6.4 oz (88.6 kg)   LMP 07/26/2022   SpO2 100%   BMI 32.52 kg/m   Physical Exam General: Alert, oriented HEENT: PERRLA, EOMI, moist mucosal membrane, normal tympanic membrane CV: Regular rate and rhythm Pulmonary: Lungs clear bilaterally no wheeze or crackles GI: Soft, nontender.  Normal bowel sounds Neuro: Cranial nerves II through XII grossly intact MSK: Tenderness to palpation between the third and fourth right MCP joint.  Tenderness palpation of the right MTP joint.  No swelling noted or erythema on either location. Psych: Pleasant affect  {Labs (Optional):23779}    Assessment & Plan:   There are no diagnoses linked to  this encounter.  Return in about 4 weeks (around 09/16/2022) for Diarrhea.   Sandre Kitty, MD

## 2022-08-19 NOTE — Patient Instructions (Signed)
It was nice to see you today,  We addressed the following topics today: -I am going to order an x-ray of your foot you can have this done at [facility] imaging.  Please call them to see if you need to schedule a time to come in - I will order some blood test regarding your joint complaints and your diarrhea.  I will also order a stool sample. - If all of this workup comes back unrevealing will refer you to a gastroenterologist. - If your rheumatoid tests are positive I can refer you to a hematologist - I like to see you back in 1 month so that we can follow-up on all these issues - In the meantime it is okay to take ibuprofen or Tylenol as long as you are not pregnant.  If you become pregnant Tylenol would be preferable and I would avoid ibuprofen or other NSAIDs.  Have a great day,  Frederic Jericho, MD

## 2022-08-20 DIAGNOSIS — K529 Noninfective gastroenteritis and colitis, unspecified: Secondary | ICD-10-CM | POA: Insufficient documentation

## 2022-08-20 DIAGNOSIS — M129 Arthropathy, unspecified: Secondary | ICD-10-CM | POA: Insufficient documentation

## 2022-08-20 DIAGNOSIS — E669 Obesity, unspecified: Secondary | ICD-10-CM | POA: Insufficient documentation

## 2022-08-20 NOTE — Assessment & Plan Note (Signed)
Patient says she has a family history of rheumatoid.  Currently experiencing chronic pain in the right fifth MTP and between the right third and fourth MCP.  Exam noted for tenderness. - Follow-up RA, CCP, ANA, uric acid level - Follow-up DG foot

## 2022-08-20 NOTE — Assessment & Plan Note (Signed)
Chronic daily watery diarrhea since approximately 1 month after the birth of her child.  Triggered by all foods.  Food elimination diets have not helped.  Nonbloody.  Differential is wide.  Would favor bile acid or pancreatic insufficiency is most likely.  Will need to try and rule out other causes including inflammatory bowel disease, infection, gluten sensitivity.  Will likely need to refer to gastroenterology. - Follow-up fecal stool studies: GI pathogen panel, fecal calprotectin - Follow-up blood test: Gluten panel, ESR, TSH, CBC

## 2022-08-21 DIAGNOSIS — Z419 Encounter for procedure for purposes other than remedying health state, unspecified: Secondary | ICD-10-CM | POA: Diagnosis not present

## 2022-08-28 ENCOUNTER — Other Ambulatory Visit: Payer: Medicaid Other

## 2022-08-28 DIAGNOSIS — K529 Noninfective gastroenteritis and colitis, unspecified: Secondary | ICD-10-CM | POA: Diagnosis not present

## 2022-08-28 DIAGNOSIS — M129 Arthropathy, unspecified: Secondary | ICD-10-CM | POA: Diagnosis not present

## 2022-08-28 DIAGNOSIS — E669 Obesity, unspecified: Secondary | ICD-10-CM | POA: Diagnosis not present

## 2022-08-29 DIAGNOSIS — K529 Noninfective gastroenteritis and colitis, unspecified: Secondary | ICD-10-CM | POA: Diagnosis not present

## 2022-09-16 ENCOUNTER — Ambulatory Visit (INDEPENDENT_AMBULATORY_CARE_PROVIDER_SITE_OTHER): Payer: Medicaid Other | Admitting: Family Medicine

## 2022-09-16 ENCOUNTER — Encounter: Payer: Self-pay | Admitting: Family Medicine

## 2022-09-16 VITALS — BP 108/67 | HR 82 | Ht 65.0 in | Wt 195.8 lb

## 2022-09-16 DIAGNOSIS — K529 Noninfective gastroenteritis and colitis, unspecified: Secondary | ICD-10-CM

## 2022-09-16 DIAGNOSIS — M129 Arthropathy, unspecified: Secondary | ICD-10-CM | POA: Diagnosis not present

## 2022-09-16 NOTE — Assessment & Plan Note (Signed)
Autoimmune, inflammatory arthritis workup was negative.  Exam grossly normal except for tenderness.  Will get x-ray of the hand and foot.  If negative will refer to sports medicine for further workup.  Advised patient she can use topical treatments for symptomatic relief.

## 2022-09-16 NOTE — Progress Notes (Unsigned)
   Established Patient Office Visit  Subjective   Patient ID: Heidi Lozano, female    DOB: 04-08-1990  Age: 32 y.o. MRN: [id number]  Chief Complaint  Patient presents with   Medical Management of Chronic Issues    HPI   Joint pain-patient still has pain in the right hand and right pinky toe.  Also complains of weakness.  States that the pain is mostly in the third fourth and fifth MCP joints and in between them.  States that the pain on the right pinky toe is also on the MTP joint.  Has occasional paresthesia but not significant.  No numbness.  Symptoms have been ongoing for several months.  Had some swelling before but no swelling currently.  Pain is typically a 4 out of 10 at baseline and may go up to a 6 out of 10 if she uses it.  Patient had a question about if a tick bite can cause this.  [place]  Her partner was bitten by a tick recently and treated empirically for Lyme disease due to the presence of a rash.  Patient does not recall getting bitten by a tick recently.  Diarrhea-patient is still having chronic multiple episodes of diarrhea daily.  We discussed referral to gastroenterology.  We discussed Metamucil to help bulk the stool.  Patient has already tried elimination of lactose and gluten.     The ASCVD Risk score (Arnett DK, et al., 2019) failed to calculate for the following reasons:   The 2019 ASCVD risk score is only valid for ages 66 to 74  Health Maintenance Due  Topic Date Due   PAP-Cervical Cytology Screening  Never done   PAP SMEAR-Modifier  Never done   HPV VACCINES (2 - 3-dose series) 07/27/2017   COVID-19 Vaccine (1 - 2023-24 season) Never done   INFLUENZA VACCINE  08/21/2022      Objective:     BP 108/67   Pulse 82   Ht 5\' 5"  (1.651 m)   Wt 195 lb 12.8 oz (88.8 kg)   LMP 07/26/2022   BMI 32.58 kg/m  {Vitals History (Optional):23777}  Physical Exam General: Alert, oriented Pulmonary: No respiratory distress MSK: No swelling  of the right hand.  Tenderness to palpation in the third fourth and fifth MTP joint.  Strength equal bilaterally.   No results found for any visits on 09/16/22.      Assessment & Plan:   Arthropathy Assessment & Plan: Autoimmune, inflammatory arthritis workup was negative.  Exam grossly normal except for tenderness.  Will get x-ray of the hand and foot.  If negative will refer to sports medicine for further workup.  Advised patient she can use topical treatments for symptomatic relief.  Orders: -     DG Hand Complete Right; Future  Chronic diarrhea Assessment & Plan: Laboratory workup was normal.  Patient still with diarrhea.  Discussed using fiber to bulk the stool.  Will refer to gastroenterology.  MRI of the abdomen from 2023 was grossly normal.  Orders: -     Ambulatory referral to Gastroenterology     Return in about 2 months (around 11/16/2022) for Hand pain.    Sandre Kitty, MD

## 2022-09-16 NOTE — Assessment & Plan Note (Signed)
Laboratory workup was normal.  Patient still with diarrhea.  Discussed using fiber to bulk the stool.  Will refer to gastroenterology.  MRI of the abdomen from 2023 was grossly normal.

## 2022-09-16 NOTE — Patient Instructions (Addendum)
It was nice to see you today,  We addressed the following topics today: -I will send in a referral to the gastroenterologist - I will put an order for an x-ray of your hand and you there is already 1 for your foot.  You can go to [facility] imaging and have this done.  I would call them first to see if you need an appointment. - You can put over-the-counter topical medications on your hand as needed.  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 - You can use over-the-counter Metamucil to help with your diarrhea.  Start with 1 dose per day and titrate up to using it 3 times a day.  Have a great day,  Frederic Jericho, MD

## 2022-09-21 DIAGNOSIS — Z419 Encounter for procedure for purposes other than remedying health state, unspecified: Secondary | ICD-10-CM | POA: Diagnosis not present

## 2022-09-23 ENCOUNTER — Encounter: Payer: Self-pay | Admitting: Family Medicine

## 2022-10-21 DIAGNOSIS — Z419 Encounter for procedure for purposes other than remedying health state, unspecified: Secondary | ICD-10-CM | POA: Diagnosis not present

## 2022-11-19 ENCOUNTER — Ambulatory Visit: Payer: Medicaid Other | Admitting: Family Medicine

## 2022-11-19 DIAGNOSIS — Z0000 Encounter for general adult medical examination without abnormal findings: Secondary | ICD-10-CM | POA: Diagnosis not present

## 2022-11-19 DIAGNOSIS — Z124 Encounter for screening for malignant neoplasm of cervix: Secondary | ICD-10-CM | POA: Diagnosis not present

## 2022-11-21 DIAGNOSIS — Z419 Encounter for procedure for purposes other than remedying health state, unspecified: Secondary | ICD-10-CM | POA: Diagnosis not present

## 2022-11-26 ENCOUNTER — Encounter: Payer: Self-pay | Admitting: Family Medicine

## 2022-11-26 ENCOUNTER — Ambulatory Visit (INDEPENDENT_AMBULATORY_CARE_PROVIDER_SITE_OTHER): Payer: Medicaid Other | Admitting: Family Medicine

## 2022-11-26 VITALS — BP 107/75 | HR 76 | Ht 65.0 in | Wt 185.1 lb

## 2022-11-26 DIAGNOSIS — M129 Arthropathy, unspecified: Secondary | ICD-10-CM | POA: Diagnosis not present

## 2022-11-26 DIAGNOSIS — K529 Noninfective gastroenteritis and colitis, unspecified: Secondary | ICD-10-CM

## 2022-11-26 NOTE — Assessment & Plan Note (Signed)
Workup so far has been negative.  Provided patient with contact information for the gastroenterologist office and advised her to call them back to schedule an appointment.

## 2022-11-26 NOTE — Assessment & Plan Note (Addendum)
Inflammatory arthropathy workup was negative.  Pain has almost completely resolved.  Still occasionally gets stiffness with prolonged use.  Exam completely normal.  Advised patient that if symptoms get worse or return I would recommend getting the x-ray of the hand we ordered and referring her to the orthopedic hand specialist.

## 2022-11-26 NOTE — Patient Instructions (Signed)
It was nice to see you today,  We addressed the following topics today: -If your symptoms in your hand get worse let us know and we can refer you to a orthopedist who specializes in hand issues.  Prior to any referral I would prefer if you had a x-ray of the hand.  Something you can do on your own to see if it may help your symptoms is use a nocturnal splint on your hands.  This is a hard splint for carpal tunnel syndrome that people wear to bed and take off in the morning.  It keeps your hand in a neutral position.  These are available over-the-counter. - The gastroenterologist office has reached out to you to try and schedule an appointment.  You can call them back at the following number [phone], option 1  Have a great day,  Frederic Jericho, MD

## 2022-11-26 NOTE — Progress Notes (Signed)
   Established Patient Office Visit  Subjective   Patient ID: Heidi Lozano, female    DOB: 24-Jun-1990  Age: 32 y.o. MRN: [id number]  Chief Complaint  Patient presents with   Hand Pain    HPI Chronic diarrhea-patient still having ongoing issues with chronic diarrhea.  These have not improved.  We discussed that the gastroenterology office has tried to contact her.  Provided patient with the information and phone number.  Patient is nervous about the thought of a colonoscopy and anesthesia.  Hand pain-pain has mostly resolved.  Still having some issues with "mobility".  Sometimes when she uses her hands too much shift feels like her hands are difficult to move and use the example of when your hands are cold and get stiff.  This happens a few times a week.  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  Symptoms are more so on the right than the left.  Hand pain-inflammatory workup negative.  Did not get imaging.  Better.  Mobility isn't there.  Pain isn't there. Feels like hands are cold.  A few times a week.   The ASCVD Risk score (Arnett DK, et al., 2019) failed to calculate for the following reasons:   The 2019 ASCVD risk score is only valid for ages 31 to 3  Health Maintenance Due  Topic Date Due   HPV VACCINES (2 - 3-dose series) 07/27/2017   INFLUENZA VACCINE  Never done   COVID-19 Vaccine (1 - 2023-24 season) Never done      Objective:     BP 107/75   Pulse 76   Ht 5\' 5"  (1.651 m)   Wt 185 lb 1.9 oz (84 kg)   SpO2 98%   BMI 30.81 kg/m    Physical Exam General: Alert, oriented MSK: No tenderness palpation of the hands bilaterally.  No masses.  No swelling.  Strength equal in the fingers bilaterally.  Normal sensation.  Negative Tinel and Phalen test.   No results found for any visits on 11/26/22.      Assessment & Plan:   Arthropathy Assessment & Plan: Inflammatory arthropathy workup was negative.  Pain has almost completely  resolved.  Still occasionally gets stiffness with prolonged use.  Exam completely normal.  Advised patient that if symptoms get worse or return I would recommend getting the x-ray of the hand we ordered and referring her to the orthopedic hand specialist.   Chronic diarrhea Assessment & Plan: Workup so far has been negative.  Provided patient with contact information for the gastroenterologist office and advised her to call them back to schedule an appointment.      Return in about 7 months (around 06/26/2023) for physical.    Sandre Kitty, MD

## 2022-12-03 ENCOUNTER — Encounter: Payer: Self-pay | Admitting: Gastroenterology

## 2022-12-09 DIAGNOSIS — R102 Pelvic and perineal pain: Secondary | ICD-10-CM | POA: Diagnosis not present

## 2022-12-21 DIAGNOSIS — Z419 Encounter for procedure for purposes other than remedying health state, unspecified: Secondary | ICD-10-CM | POA: Diagnosis not present

## 2023-01-05 NOTE — Progress Notes (Signed)
Chief Complaint: Altered bowel habits Primary GI MD: Gentry Fitz  HPI: 32 year old female medical history as listed below presents for evaluation of chronic diarrhea referred here by PCP  Patient has had ongoing chronic multiple episodes of diarrhea daily.  PCP recommended Metamucil.  Patient has tried illumination of lactose and gluten without improvement.  She did have an MRI of her abdomen in 2023 which was normal  Recent labs 08/28/2022 CBC with no anemia Sed rate 2 Normal TSH Negative celiac serology Fecal calprotectin less than 5 GI pathogen panel negative  Patient states since having her son a year ago she has been dealing with altered bowel habits.  She states she will typically go a few days without a bowel movement and will have a bowel movement consistent of small pebbles or "soupy porridge."  She often will eat a meal and 10 to 15 minutes later for like she has to go to the bathroom but then cannot produce a bowel movement.  She has tried fiber supplements and dietary changes without improvement.  Denies abdominal pain.  Reports losing a few pounds but cannot quantify it.  States she occasionally has nausea but this is typically associated with anxiety.  Sometimes she has rectal discomfort in which she feels "swollen down there."  She also notes she was very nervous about this appointment as she would just like to keep yourself healthy for her son.  Denies hematochezia/melena   Past Medical History:  Diagnosis Date   Lumbar herniated disc    Medical history non-contributory     Past Surgical History:  Procedure Laterality Date   NO PAST SURGERIES      Current Outpatient Medications  Medication Sig Dispense Refill   acetaminophen (TYLENOL) 325 MG tablet Take 325 mg by mouth daily as needed (pain).     hydrocortisone (ANUSOL-HC) 2.5 % rectal cream Place 1 Application rectally 2 (two) times daily for 14 days. 30 g 0   ibuprofen (ADVIL) 600 MG tablet Take 1 tablet (600  mg total) by mouth every 6 (six) hours. 30 tablet 0   No current facility-administered medications for this visit.    Allergies as of 01/06/2023 - Review Complete 01/06/2023  Allergen Reaction Noted   Coconut flavoring agent (non-screening) Swelling and Other (See Comments) 01/23/2021    Family History  Problem Relation Age of Onset   Diabetes Mother    Breast cancer Mother    Lupus Mother    Cancer Father    Arthritis/Rheumatoid Paternal Grandmother     Social History   Socioeconomic History   Marital status: Single    Spouse name: Not on file   Number of children: 1   Years of education: Not on file   Highest education level: Not on file  Occupational History   Not on file  Tobacco Use   Smoking status: Never   Smokeless tobacco: Never  Vaping Use   Vaping status: Former  Substance and Sexual Activity   Alcohol use: Never   Drug use: Never   Sexual activity: Yes  Other Topics Concern   Not on file  Social History Narrative   Not on file   Social Drivers of Health   Financial Resource Strain: Low Risk  (06/29/2017)   Received from [facility], [facility]   Overall Financial Resource Strain (CARDIA)    Difficulty of Paying Living Expenses: Not hard at all  Food Insecurity: No Food Insecurity (06/29/2017)   Received from [facility], [facility]  Hunger Vital Sign    Worried About Running Out of Food in the Last Year: Never true    Ran Out of Food in the Last Year: Never true  Transportation Needs: No Transportation Needs (06/29/2017)   Received from [facility], [facility]   PRAPARE - Transportation    Lack of Transportation (Medical): No    Lack of Transportation (Non-Medical): No  Physical Activity: Insufficiently Active (06/29/2017)   Received from [facility], [facility]   Exercise Vital Sign    Days of Exercise per Week: 1 day    Minutes of Exercise per Session: 20 min  Stress: Stress Concern Present (06/29/2017)   Received from  [facility], [facility] - Occupational Stress Questionnaire    Feeling of Stress : Rather much  Social Connections: Moderately Isolated (06/29/2017)   Received from [facility], [facility]   Social Connection and Isolation Panel [NHANES]    Frequency of Communication with Friends and Family: Three times a week    Frequency of Social Gatherings with Friends and Family: Twice a week    Attends Religious Services: Never    Database administrator or Organizations: No    Attends Banker Meetings: Never    Marital Status: Never married  Intimate Partner Violence: Not At Risk (06/29/2017)   Received from [employer], [facility]   Humiliation, Afraid, Rape, and Kick questionnaire    Fear of Current or Ex-Partner: No    Emotionally Abused: No    Physically Abused: No    Sexually Abused: No    Review of Systems:    Constitutional: No weight loss, fever, chills, weakness or fatigue HEENT: Eyes: No change in vision               Ears, Nose, Throat:  No change in hearing or congestion Skin: No rash or itching Cardiovascular: No chest pain, chest pressure or palpitations   Respiratory: No SOB or cough Gastrointestinal: See HPI and otherwise negative Genitourinary: No dysuria or change in urinary frequency Neurological: No headache, dizziness or syncope Musculoskeletal: No new muscle or joint pain Hematologic: No bleeding or bruising Psychiatric: No history of depression or anxiety    Physical Exam:  Vital signs: BP 116/68   Pulse 88   Ht 5\' 5"  (1.651 m)   Wt 183 lb 8 oz (83.2 kg)   LMP 12/30/2022   SpO2 96%   BMI 30.54 kg/m   Constitutional: NAD, Well developed, Well nourished, alert and cooperative Head:  Normocephalic and atraumatic. Eyes:   PEERL, EOMI. No icterus. Conjunctiva pink. Respiratory: Respirations even and unlabored. Lungs clear to auscultation bilaterally.   No wheezes, crackles, or rhonchi.   Cardiovascular:  Regular rate and rhythm. No peripheral edema, cyanosis or pallor.  Gastrointestinal:  Soft, nondistended, nontender. No rebound or guarding.  Hypoactive bowel sounds. No appreciable masses or hepatomegaly. Rectal: Joni Reining CMA chaperone.  Moderate sized external hemorrhoid nonthrombosed, nontender.  Small fissure noted at 7:00.  No evidence of internal hemorrhoids.  Normal sphincter tone.  No tenderness. Msk:  Symmetrical without gross deformities. Without edema, no deformity or joint abnormality.  Neurologic:  Alert and  oriented x4;  grossly normal neurologically.  Skin:   Dry and intact without significant lesions or rashes. Psychiatric: Oriented to person, place and time. Demonstrates good judgement and reason without abnormal affect or behaviors.   RELEVANT LABS AND IMAGING: CBC    Component Value Date/Time   WBC  6.1 08/28/2022 0930   WBC 11.9 (H) 09/17/2021 0550   RBC 5.04 08/28/2022 0930   RBC 4.03 09/17/2021 0550   HGB 14.3 08/28/2022 0930   HCT 43.5 08/28/2022 0930   PLT 356 08/28/2022 0930   MCV 86 08/28/2022 0930   MCH 28.4 08/28/2022 0930   MCH 28.5 09/17/2021 0550   MCHC 32.9 08/28/2022 0930   MCHC 33.5 09/17/2021 0550   RDW 12.9 08/28/2022 0930   LYMPHSABS 2.1 01/23/2021 1930   MONOABS 0.6 01/23/2021 1930   EOSABS 0.0 01/23/2021 1930   BASOSABS 0.1 01/23/2021 1930    CMP     Component Value Date/Time   NA 135 01/23/2021 1944   K 3.6 01/23/2021 1944   CL 103 01/23/2021 1944   CO2 23 01/23/2021 1944   GLUCOSE 100 (H) 01/23/2021 1944   BUN 17 01/23/2021 1944   CREATININE 0.71 01/23/2021 1944   CALCIUM 9.2 01/23/2021 1944   GFRNONAA >60 01/23/2021 1944   GFRAA  03/25/2010 1908    NOT CALCULATED        The eGFR has been calculated using the MDRD equation. This calculation has not been validated in all clinical situations. eGFR's persistently <60 mL/min signify possible Chronic Kidney Disease.     Assessment/Plan:   Overflow  diarrhea Constipation, unspecified constipation type Suspect patient likely has constipation with periods of diarrhea as she will go a few days without a bowel movement and it is typically pellet-like or "soupy" consistency.  This is also likely exacerbated by her anxiety. - Reassurance - MiraLAX 1 capful daily and adjust dose based on response - KUB per patient request for imaging, we can rule out stool burden with this KUB. - Increase water, increase fiber, increase exercise - Follow-up 3 months - Please send a MyChart message in 3 weeks with an update  Hemorrhoids, unspecified hemorrhoid type Presence of external hemorrhoid and very small fissure on rectal exam likely exacerbated by her constipation. - Hydrocortisone cream twice daily for 2 weeks - Sitz bath's, increase fiber, optimize bowel regimen to prevent worsening constipation  Patient was assigned to Dr. Chales Abrahams today  Boone Master, PA-C [facility] 01/06/2023, 10:55 AM  Cc: Sandre Kitty, MD

## 2023-01-06 ENCOUNTER — Ambulatory Visit: Payer: Medicaid Other | Admitting: Gastroenterology

## 2023-01-06 ENCOUNTER — Ambulatory Visit (INDEPENDENT_AMBULATORY_CARE_PROVIDER_SITE_OTHER)
Admission: RE | Admit: 2023-01-06 | Discharge: 2023-01-06 | Disposition: A | Discharge status: Home / Self Care | Payer: Medicaid Other | Source: Ambulatory Visit | Attending: Gastroenterology | Admitting: Gastroenterology

## 2023-01-06 ENCOUNTER — Encounter: Payer: Self-pay | Admitting: Gastroenterology

## 2023-01-06 DIAGNOSIS — R110 Nausea: Secondary | ICD-10-CM | POA: Diagnosis not present

## 2023-01-06 DIAGNOSIS — R109 Unspecified abdominal pain: Secondary | ICD-10-CM | POA: Diagnosis not present

## 2023-01-06 DIAGNOSIS — R197 Diarrhea, unspecified: Secondary | ICD-10-CM

## 2023-01-06 DIAGNOSIS — K5900 Constipation, unspecified: Secondary | ICD-10-CM

## 2023-01-06 DIAGNOSIS — K649 Unspecified hemorrhoids: Secondary | ICD-10-CM

## 2023-01-06 MED ORDER — HYDROCORTISONE (PERIANAL) 2.5 % EX CREA
1.0000 | TOPICAL_CREAM | Freq: Two times a day (BID) | CUTANEOUS | 0 refills | Status: AC
Start: 2023-01-06 — End: 2023-01-20

## 2023-01-06 NOTE — Patient Instructions (Addendum)
_______________________________________________________  If your blood pressure at your visit was 140/90 or greater, please contact your primary care physician to follow up on this.  If you are age 30 or younger, your body mass index should be between 19-25. Your Body mass index is 30.54 kg/m. If this is out of the aformentioned range listed, please consider follow up with your Primary Care Provider.  ________________________________________________________  The [facility] providers would like to encourage you to use [facility] to communicate with providers for non-urgent requests or questions.  Due to long hold times on the telephone, sending your provider a message by [facility] may be a faster and more efficient way to get a response.  Please allow 48 business hours for a response.  Please remember that this is for non-urgent requests.  _______________________________________________________  Your provider has requested that you have an abdominal x ray before leaving today. Please go to the basement floor to our Radiology department for the test.   We have sent the following medications to your pharmacy for you to pick up at your convenience:  START: hydrcortisone cream 2.5% twice daily for 14 days.  Start taking Miralax 1 capful (17 grams) 1x / day for 1 week.   If this is not effective, increase to 1 dose 2x / day for 1 week.   If this is still not effective, increase to two capfuls (34 grams) 2x / day.   Can adjust dose as needed based on response. Can take 1/2 cap daily, skip days, or increase per day.    Due to recent changes in healthcare laws, you may see the results of your imaging and laboratory studies on MyChart before your provider has had a chance to review them.  We understand that in some cases there may be results that are confusing or concerning to you. Not all laboratory results come back in the same time frame and the provider may be waiting for multiple results in order to  interpret others.  Please give [place] 48 hours in order for your provider to thoroughly review all the results before contacting the office for clarification of your results.   Thank you for entrusting me with your care and choosing [facility].  [name], PA-C

## 2023-01-07 NOTE — Progress Notes (Signed)
Agree with assessment/plan.  Raj Florestine Carmical, MD [facility] [phone]  

## 2023-01-21 DIAGNOSIS — Z419 Encounter for procedure for purposes other than remedying health state, unspecified: Secondary | ICD-10-CM | POA: Diagnosis not present

## 2023-02-21 DIAGNOSIS — Z419 Encounter for procedure for purposes other than remedying health state, unspecified: Secondary | ICD-10-CM | POA: Diagnosis not present

## 2023-03-21 DIAGNOSIS — Z419 Encounter for procedure for purposes other than remedying health state, unspecified: Secondary | ICD-10-CM | POA: Diagnosis not present

## 2023-03-26 DIAGNOSIS — R102 Pelvic and perineal pain: Secondary | ICD-10-CM | POA: Diagnosis not present

## 2023-03-26 DIAGNOSIS — R1032 Left lower quadrant pain: Secondary | ICD-10-CM | POA: Diagnosis not present

## 2023-05-02 DIAGNOSIS — Z419 Encounter for procedure for purposes other than remedying health state, unspecified: Secondary | ICD-10-CM | POA: Diagnosis not present

## 2023-06-01 DIAGNOSIS — Z419 Encounter for procedure for purposes other than remedying health state, unspecified: Secondary | ICD-10-CM | POA: Diagnosis not present

## 2023-06-10 ENCOUNTER — Ambulatory Visit (INDEPENDENT_AMBULATORY_CARE_PROVIDER_SITE_OTHER): Admitting: Gastroenterology

## 2023-06-10 ENCOUNTER — Encounter: Payer: Self-pay | Admitting: Gastroenterology

## 2023-06-10 VITALS — BP 120/80 | HR 75 | Ht 65.0 in | Wt 162.0 lb

## 2023-06-10 DIAGNOSIS — K6289 Other specified diseases of anus and rectum: Secondary | ICD-10-CM

## 2023-06-10 DIAGNOSIS — R140 Abdominal distension (gaseous): Secondary | ICD-10-CM

## 2023-06-10 DIAGNOSIS — K644 Residual hemorrhoidal skin tags: Secondary | ICD-10-CM

## 2023-06-10 DIAGNOSIS — R194 Change in bowel habit: Secondary | ICD-10-CM | POA: Diagnosis not present

## 2023-06-10 DIAGNOSIS — K649 Unspecified hemorrhoids: Secondary | ICD-10-CM

## 2023-06-10 DIAGNOSIS — R1030 Lower abdominal pain, unspecified: Secondary | ICD-10-CM

## 2023-06-10 MED ORDER — DICYCLOMINE HCL 10 MG PO CAPS: 10.0000 mg | ORAL_CAPSULE | Freq: Three times a day (TID) | ORAL | 3 refills | Status: DC

## 2023-06-10 NOTE — Patient Instructions (Addendum)
 We have sent the following medications to your pharmacy for you to pick up at your convenience: Dicyclomine   Your provider has requested that you go to the basement level for lab work before leaving today. Press "B" on the elevator. The lab is located at the first door on the left as you exit the elevator.  Due to recent changes in healthcare laws, you may see the results of your imaging and laboratory studies on MyChart before your provider has had a chance to review them.  We understand that in some cases there may be results that are confusing or concerning to you. Not all laboratory results come back in the same time frame and the provider may be waiting for multiple results in order to interpret others.  Please give us  48 hours in order for your provider to thoroughly review all the results before contacting the office for clarification of your results.    You have been given a testing kit to check for small intestine bacterial overgrowth (SIBO) which is completed by a company named [facility]. Make sure to return your test in the mail using the return mailing label given to you along with the kit. The test order, your demographic and insurance information have all already been sent to the company. [facility] will collect an upfront charge of $99.74 for commercial insurance plans and $209.74 if you are paying cash. Make sure to discuss with [facility] PRIOR to having the test to see if they have gotten information from your insurance company as to how much your testing will cost out of pocket, if any. Please contact [facility] at phone number [phone] to get instructions regarding how to perform the test as our office is unable to give specific testing instructions.  _______________________________________________________  If your blood pressure at your visit was 140/90 or greater, please contact your primary care physician to follow up on  this.  _______________________________________________________  If you are age 31 or older, your body mass index should be between 23-30. Your Body mass index is 26.96 kg/m. If this is out of the aforementioned range listed, please consider follow up with your Primary Care Provider.  If you are age 20 or younger, your body mass index should be between 19-25. Your Body mass index is 26.96 kg/m. If this is out of the aformentioned range listed, please consider follow up with your Primary Care Provider.   ________________________________________________________  The [facility] providers would like to encourage you to use MYCHART to communicate with providers for non-urgent requests or questions.  Due to long hold times on the telephone, sending your provider a message by [facility] may be a faster and more efficient way to get a response.  Please allow 48 business hours for a response.  Please remember that this is for non-urgent requests.  _______________________________________________________  I appreciate the opportunity to care for you. Percell Boyers, [place]

## 2023-06-10 NOTE — Progress Notes (Signed)
 Chief Complaint: Follow-up Primary GI MD: Dr. Venice Gillis  HPI: Discussed the use of AI scribe software for clinical note transcription with the patient, who gave verbal consent to proceed.  History of Present Illness Heidi Lozano is a 33 year old female who presents with persistent gastrointestinal discomfort and altered bowel habits.  She experiences ongoing gastrointestinal discomfort, which has worsened since her last visit. The pain is described as sharp and stabbing, noticeable but not severe, and sometimes takes a minute to subside. The discomfort is generalized and occurs regardless of food intake.  Her bowel movements are liquid, dark, greasy, and floating, with a yellowish tinge. Despite using Miralax and fiber, there has been no improvement in her symptoms. She has been documenting her symptoms with photographs due to concern.  She has a history of hemorrhoids and anal fissures, for which she was previously prescribed hemorrhoid cream. However, the hemorrhoids and fissures have not improved and continue to cause pain. She is almost out of the cream and describes ongoing discomfort after bowel movements, with occasional leakage.  Her family history is significant for cancer, which has caused her concern about her symptoms. She has undergone abdominal imaging, including an MRI and x-ray.  She wants to manage her symptoms effectively to avoid embarrassment and improve her quality of life, particularly in social settings such as mom groups.      Past Medical History:  Diagnosis Date   Lumbar herniated disc     Past Surgical History:  Procedure Laterality Date   NO PAST SURGERIES      Current Outpatient Medications  Medication Sig Dispense Refill   acetaminophen  (TYLENOL ) 325 MG tablet Take 325 mg by mouth daily as needed (pain).     ibuprofen  (ADVIL ) 600 MG tablet Take 1 tablet (600 mg total) by mouth every 6 (six) hours. 30 tablet 0   No current facility-administered  medications for this visit.    Allergies as of 06/10/2023 - Review Complete 01/06/2023  Allergen Reaction Noted   Coconut flavoring agent (non-screening) Swelling and Other (See Comments) 01/23/2021    Family History  Problem Relation Age of Onset   Diabetes Mother    Breast cancer Mother    Lupus Mother    Cancer Father    Arthritis/Rheumatoid Paternal Grandmother     Social History   Socioeconomic History   Marital status: Single    Spouse name: Not on file   Number of children: 1   Years of education: Not on file   Highest education level: Not on file  Occupational History   Not on file  Tobacco Use   Smoking status: Never   Smokeless tobacco: Never  Vaping Use   Vaping status: Former  Substance and Sexual Activity   Alcohol use: Never   Drug use: Never   Sexual activity: Yes  Other Topics Concern   Not on file  Social History Narrative   Not on file   Social Drivers of Health   Financial Resource Strain: Low Risk  (06/29/2017)   Received from [facility], [facility]   Overall Financial Resource Strain (CARDIA)    Difficulty of Paying Living Expenses: Not hard at all  Food Insecurity: No Food Insecurity (06/29/2017)   Received from [facility], [facility]   Hunger Vital Sign    Worried About Running Out of Food in the Last Year: Never true    Ran Out of Food in the Last Year: Never true  Transportation Needs: No Transportation  Needs (06/29/2017)   Received from [facility], [facility]   [facility] - Transportation    Lack of Transportation (Medical): No    Lack of Transportation (Non-Medical): No  Physical Activity: Insufficiently Active (06/29/2017)   Received from [facility], [facility]   Exercise Vital Sign    Days of Exercise per Week: 1 day    Minutes of Exercise per Session: 20 min  Stress: Stress Concern Present (06/29/2017)   Received from [facility], [facility] - Occupational  Stress Questionnaire    Feeling of Stress : Rather much  Social Connections: Moderately Isolated (06/29/2017)   [facility]   Social Connection and Isolation Panel [NHANES]    Frequency of Communication with Friends and Family: Three times a week    Frequency of Social Gatherings with Friends and Family: Twice a week    Attends Religious Services: Never    Database administrator or Organizations: No    Attends Banker Meetings: Never    Marital Status: Never married  Intimate Partner Violence: Not At Risk (06/29/2017)   Received from [employer], [facility]   Humiliation, Afraid, Rape, and Kick questionnaire    Fear of Current or Ex-Partner: No    Emotionally Abused: No    Physically Abused: No    Sexually Abused: No    Review of Systems:    Constitutional: No weight loss, fever, chills, weakness or fatigue HEENT: Eyes: No change in vision               Ears, Nose, Throat:  No change in hearing or congestion Skin: No rash or itching Cardiovascular: No chest pain, chest pressure or palpitations   Respiratory: No SOB or cough Gastrointestinal: See HPI and otherwise negative Genitourinary: No dysuria or change in urinary frequency Neurological: No headache, dizziness or syncope Musculoskeletal: No new muscle or joint pain Hematologic: No bleeding or bruising Psychiatric: No history of depression or anxiety    Physical Exam:  Vital signs: There were no vitals taken for this visit.  Constitutional: NAD, alert and cooperative Head:  Normocephalic and atraumatic. Eyes:   PEERL, EOMI. No icterus. Conjunctiva pink. Respiratory: Respirations even and unlabored. Lungs clear to auscultation bilaterally.   No wheezes, crackles, or rhonchi.  Cardiovascular:  Regular rate and rhythm. No peripheral edema, cyanosis or pallor.  Gastrointestinal:  Soft, nondistended, nontender. No rebound or guarding. Normal bowel sounds. No appreciable masses  or hepatomegaly. Rectal:  Declines Msk:  Symmetrical without gross deformities. Without edema, no deformity or joint abnormality.  Neurologic:  Alert and  oriented x4;  grossly normal neurologically.  Skin:   Dry and intact without significant lesions or rashes. Psychiatric: Oriented to person, place and time. Demonstrates good judgement and reason without abnormal affect or behaviors.  Physical Exam    RELEVANT LABS AND IMAGING: CBC    Component Value Date/Time   WBC 6.1 08/28/2022 0930   WBC 11.9 (H) 09/17/2021 0550   RBC 5.04 08/28/2022 0930   RBC 4.03 09/17/2021 0550   HGB 14.3 08/28/2022 0930   HCT 43.5 08/28/2022 0930   PLT 356 08/28/2022 0930   MCV 86 08/28/2022 0930   MCH 28.4 08/28/2022 0930   MCH 28.5 09/17/2021 0550   MCHC 32.9 08/28/2022 0930   MCHC 33.5 09/17/2021 0550   RDW 12.9 08/28/2022 0930   LYMPHSABS 2.1 01/23/2021 1930   MONOABS 0.6 01/23/2021 1930   EOSABS 0.0  01/23/2021 1930   BASOSABS 0.1 01/23/2021 1930    CMP     Component Value Date/Time   NA 135 01/23/2021 1944   K 3.6 01/23/2021 1944   CL 103 01/23/2021 1944   CO2 23 01/23/2021 1944   GLUCOSE 100 (H) 01/23/2021 1944   BUN 17 01/23/2021 1944   CREATININE 0.71 01/23/2021 1944   CALCIUM 9.2 01/23/2021 1944   GFRNONAA >60 01/23/2021 1944   GFRAA  03/25/2010 1908    NOT CALCULATED        The eGFR has been calculated using the MDRD equation. This calculation has not been validated in all clinical situations. eGFR's persistently <60 mL/min signify possible Chronic Kidney Disease.     Assessment/Plan:   Altered bowel habits Seen December 2024 with intermittent constipation/diarrhea since having her son.  Normal fecal calprotectin, GI pathogen negative, negative celiac serology, normal TSH.  Recommended MiraLAX and fiber with no results. Continuing to have altered loose stools/formed stools, bloating, lower abdominal pain. Suspect IBS but questionable greasy/floating stools. Patient  is anxious about her symptoms. -- pancreatic fecal elastase -- SIBO breath test -- Dicyclomine 10mg  TID 30 days refill x 3 -- if above studies are negative, consider CT scan. If CT scan negative consider colonoscopy (patient would prefer to avoid colonoscopy).  Hemorrhoids External hemorrhoid and small fissure on rectal exam at last appointment provided with hydrocortisone  cream. Continued occasional rectal discomfort. -- NTG 0.125% BID -- increase water, increase fiber, increase exercise  [facility] 06/10/2023, 9:13 AM  Cc: Laneta Pintos, MD

## 2023-06-13 NOTE — Progress Notes (Signed)
 Agree with assessment/plan. Pl arrange for FU in 12-18 weeks  Magnus Schuller, MD [facility] [phone]

## 2023-06-19 ENCOUNTER — Encounter: Payer: Self-pay | Admitting: Family Medicine

## 2023-06-19 ENCOUNTER — Other Ambulatory Visit

## 2023-06-19 ENCOUNTER — Telehealth: Payer: Self-pay | Admitting: Gastroenterology

## 2023-06-19 DIAGNOSIS — R140 Abdominal distension (gaseous): Secondary | ICD-10-CM

## 2023-06-19 DIAGNOSIS — R1030 Lower abdominal pain, unspecified: Secondary | ICD-10-CM

## 2023-06-19 DIAGNOSIS — R194 Change in bowel habit: Secondary | ICD-10-CM

## 2023-06-19 NOTE — Telephone Encounter (Signed)
 Left message for patient to call back

## 2023-06-19 NOTE — Telephone Encounter (Signed)
 PT called to find out if there is an alternative to SIBO testing. She realizes that it will not be covered by insurance and she cannot afford it. Please advise.

## 2023-06-19 NOTE — Telephone Encounter (Signed)
 Heidi Lozano-  Per patient, she is unable to afford SIBO testing. Is this something you want to treat her empirically for?

## 2023-06-22 ENCOUNTER — Other Ambulatory Visit: Payer: Self-pay | Admitting: Family Medicine

## 2023-06-22 DIAGNOSIS — R61 Generalized hyperhidrosis: Secondary | ICD-10-CM

## 2023-06-22 NOTE — Telephone Encounter (Signed)
 Left message for patient to call back

## 2023-06-23 MED ORDER — METRONIDAZOLE 250 MG PO TABS: 250.0000 mg | ORAL_TABLET | Freq: Three times a day (TID) | ORAL | 0 refills | Status: AC

## 2023-06-23 NOTE — Telephone Encounter (Signed)
===  View-only below this line=== ----- Message ----- From: Silver Dross Sent: 06/19/2023  12:53 PM EDT To: Glennette Lanius, RN  Can send in metronidazole 250mg  TID 14 days. Dx code SIBO/ IBS-D

## 2023-06-23 NOTE — Telephone Encounter (Signed)
 I have spoken to patient to advise that we can treat SIBO empirically with metronidazole 250 mg three times daily x 14 days since she is unable to move forward with SIBO testing. Patient is agreeable to this. Rx sent to pharmacy. She has been advised not to consume any alcohol while taking metronidazole as it can cause very unpleasant GI symptoms.  Patient tells me that over the last couple of weeks (following her appointment with our office), she has noticed multiple swollen lymph nodes in the head/neck area; one of which she describes as non-painful, immobile and hard. She also indicates that she has had some discomfort with swallowing and feels that foods get stuck further down her esophagus. +Night sweats. I advised patient that she should certainly follow up ASAP with her PCP about this so she can be further evaluated. Patient states that her PCP told her that GI would be the appropriate modality to go through for this. Upon further questioning, patient says that some of what appears to her to be swollen nodes are "in my throat." I advised that I would still recommend PCP evaluation for specific identification and location of her concern, as this could even be an ENT issue.  She says she does have an appointment with her PCP already on 06/26/23 for routine physical.

## 2023-06-25 ENCOUNTER — Ambulatory Visit: Payer: Self-pay | Admitting: Gastroenterology

## 2023-06-25 DIAGNOSIS — R194 Change in bowel habit: Secondary | ICD-10-CM

## 2023-06-25 DIAGNOSIS — R1030 Lower abdominal pain, unspecified: Secondary | ICD-10-CM

## 2023-06-25 DIAGNOSIS — K625 Hemorrhage of anus and rectum: Secondary | ICD-10-CM

## 2023-06-25 LAB — PANCREATIC ELASTASE, FECAL: Pancreatic Elastase-1, Stool: >800 ug/g (ref >200)

## 2023-06-26 ENCOUNTER — Ambulatory Visit (INDEPENDENT_AMBULATORY_CARE_PROVIDER_SITE_OTHER): Payer: Medicaid Other | Admitting: Family Medicine

## 2023-06-26 ENCOUNTER — Encounter: Payer: Self-pay | Admitting: Family Medicine

## 2023-06-26 VITALS — BP 122/78 | HR 83 | Ht 65.0 in | Wt 159.8 lb

## 2023-06-26 DIAGNOSIS — E786 Lipoprotein deficiency: Secondary | ICD-10-CM

## 2023-06-26 DIAGNOSIS — K529 Noninfective gastroenteritis and colitis, unspecified: Secondary | ICD-10-CM

## 2023-06-26 DIAGNOSIS — R5383 Other fatigue: Secondary | ICD-10-CM

## 2023-06-26 DIAGNOSIS — R61 Generalized hyperhidrosis: Secondary | ICD-10-CM | POA: Diagnosis not present

## 2023-06-26 DIAGNOSIS — R09A2 Foreign body sensation, throat: Secondary | ICD-10-CM

## 2023-06-26 NOTE — Progress Notes (Signed)
   Annual physical  Subjective    Patient ID: Heidi Lozano, female    DOB: September 03, 1990  Age: 33 y.o. MRN: [id number]  Chief Complaint  Patient presents with   Annual Exam   HPI Heidi Lozano is a 33 y.o. old female here  for annual exam.   Subjective - Night sweats for 2 weeks (partner reports closer to 1 month) - Fatigue, feeling exhausted by 3:00 PM - Dry cough, possibly allergy-related - Reports intermittent swelling in neck area and uvula - Wisdom tooth discomfort for 2 days - Difficulty swallowing  Medications: Flagyl  (metronidazole ) 42 tablets for SIBO (small intestinal bacterial overgrowth), recently prescribed, not yet started.  PMH, PSH, FH, Social Hx: No tobacco use, no alcohol use. Regular menstrual periods, last period 06/10/2023-06/14/2023. Using condoms for contraception. Has a 39-year-old son (turns 2 in August). Family history of thyroid  disease (sister). Currently doing intermittent fasting, avoiding processed foods, cooking at home. Exercise limited to childcare activities.  ROS: Denies breathing difficulties during sleep. Reports waking frequently during night but states this is not new. Reports regular sleep schedule from approximately 7:30-8:00 PM to 6:00 AM. Denies swelling on right side of neck.    The ASCVD Risk score (Arnett DK, et al., 2019) failed to calculate for the following reasons:   The 2019 ASCVD risk score is only valid for ages 87 to 68  Health Maintenance Due  Topic Date Due   COVID-19 Vaccine (1) Never done   HPV VACCINES (2 - Risk 3-dose series) 31/08/2017      Objective:     BP 122/78   Pulse 83   Ht 5\' 5"  (1.651 m)   Wt 159 lb 12 oz (72.5 kg)   SpO2 98%   BMI 26.58 kg/m    Physical Exam  Gen: alert, oriented Heent: perrla, eomi Cv: rrr Pulm: lctab Neuro: cn 2-12 grossly intact.  Normal strength and sensation.      Assessment & Plan:   Other fatigue Assessment & Plan: Denies apnea or snoring.  Gets enough sleep overall,  but does have frequent nighttime awakenings d/t having 39 year old son. Checking cmp, iron, tsh, cmp, cbc  Orders: -     Comprehensive metabolic panel with GFR -     Iron, TIBC and Ferritin Panel  Low HDL (under 40) -     Lipid panel  Chronic diarrhea Assessment & Plan:    - Currently manged by GI    - Starting metronidazole  treatment for possible sibo    - Scheduled for colonoscopy next week   Night sweats Assessment & Plan: Recent onset of night sweats.  Partner has noticed it as well.  Also endorsing fatigue.  Endorses generally being warm natured.  Keeps a/c set at < 70     - Differential includes thyroid  dysfunction, anemia, infection    - Labs ordered: CBC, iron studies, thyroid  panel, CRP, HIV, lipid panel, A1C    - Follow up in 3 months or sooner if symptoms persist   Sensation of lump in throat Assessment & Plan: Pt complained of feeling a lump on the left side of her throat. On exam the area of concern corrrelated to her hyoid bone.  Reassured pt that this is a normal part of her anatomy and not concerning for thyroid  nodule or other issue.        Return in about 3 months (around 09/26/2023) for fatigue.    Laneta Pintos, MD

## 2023-06-26 NOTE — Patient Instructions (Addendum)
 It was nice to see you today,  We addressed the following topics today: -I am going to get some lab test to rule out causes of your fatigue. - What we do next will depend on the results - Let your gastroenterologist know if you are having occultly swallowing. - The lump you are feeling in your neck is your hyoid bone.  If you get any swelling elsewhere or other concerning bumps or nodules let us  know and we can evaluate them.    Have a great day,  Etha Henle, MD

## 2023-06-27 LAB — COMPREHENSIVE METABOLIC PANEL WITH GFR
ALT: 15 IU/L (ref 0–32)
AST: 12 IU/L (ref 0–40)
Albumin: 4.7 g/dL (ref 4.0–5.0)
Alkaline Phosphatase: 73 IU/L (ref 44–121)
BUN/Creatinine Ratio: 14 (ref 9–23)
BUN: 10 mg/dL (ref 6–20)
Bilirubin Total: 0.4 mg/dL (ref 0.0–1.2)
CO2: 22 mmol/L (ref 20–29)
Calcium: 9.4 mg/dL (ref 8.7–10.2)
Chloride: 102 mmol/L (ref 96–106)
Creatinine, Ser: 0.71 mg/dL (ref 0.57–1.00)
Globulin, Total: 2.8 g/dL (ref 1.5–4.5)
Glucose: 91 mg/dL (ref 70–99)
Potassium: 4.4 mmol/L (ref 3.5–5.2)
Sodium: 138 mmol/L (ref 134–144)
Total Protein: 7.5 g/dL (ref 6.0–8.5)
eGFR: 117 mL/min/{1.73_m2} (ref >59)

## 2023-06-27 LAB — LIPID PANEL
Chol/HDL Ratio: 2.9 ratio (ref 0.0–4.4)
Cholesterol, Total: 135 mg/dL (ref 100–199)
HDL: 46 mg/dL (ref >39)
LDL Chol Calc (NIH): 72 mg/dL (ref 0–99)
Triglycerides: 87 mg/dL (ref 0–149)
VLDL Cholesterol Cal: 17 mg/dL (ref 5–40)

## 2023-06-27 LAB — IRON,TIBC AND FERRITIN PANEL
Ferritin: 66 ng/mL (ref 15–150)
Iron Saturation: 27 % (ref 15–55)
Iron: 79 ug/dL (ref 27–159)
Total Iron Binding Capacity: 295 ug/dL (ref 250–450)
UIBC: 216 ug/dL (ref 131–425)

## 2023-06-29 ENCOUNTER — Ambulatory Visit: Payer: Self-pay | Admitting: Family Medicine

## 2023-06-29 DIAGNOSIS — R5383 Other fatigue: Secondary | ICD-10-CM | POA: Insufficient documentation

## 2023-06-29 DIAGNOSIS — R61 Generalized hyperhidrosis: Secondary | ICD-10-CM | POA: Insufficient documentation

## 2023-06-29 DIAGNOSIS — R09A2 Foreign body sensation, throat: Secondary | ICD-10-CM | POA: Insufficient documentation

## 2023-06-29 NOTE — Assessment & Plan Note (Signed)
-   Currently manged by GI    - Starting metronidazole  treatment for possible sibo    - Scheduled for colonoscopy next week

## 2023-06-29 NOTE — Assessment & Plan Note (Addendum)
 Recent onset of night sweats.  Partner has noticed it as well.  Also endorsing fatigue.  Endorses generally being warm natured.  Keeps a/c set at < 70     - Differential includes thyroid  dysfunction, anemia, infection    - Labs ordered: CBC, iron studies, thyroid  panel, CRP, HIV, lipid panel, A1C    - Follow up in 3 months or sooner if symptoms persist

## 2023-06-29 NOTE — Assessment & Plan Note (Signed)
 Pt complained of feeling a lump on the left side of her throat. On exam the area of concern corrrelated to her hyoid bone.  Reassured pt that this is a normal part of her anatomy and not concerning for thyroid  nodule or other issue.

## 2023-06-29 NOTE — Assessment & Plan Note (Signed)
 Denies apnea or snoring.  Gets enough sleep overall, but does have frequent nighttime awakenings d/t having 31 year old son. Checking cmp, iron, tsh, cmp, cbc

## 2023-07-01 ENCOUNTER — Other Ambulatory Visit

## 2023-07-01 DIAGNOSIS — R61 Generalized hyperhidrosis: Secondary | ICD-10-CM | POA: Diagnosis not present

## 2023-07-02 ENCOUNTER — Ambulatory Visit: Payer: Self-pay | Admitting: Family Medicine

## 2023-07-02 DIAGNOSIS — Z419 Encounter for procedure for purposes other than remedying health state, unspecified: Secondary | ICD-10-CM | POA: Diagnosis not present

## 2023-07-06 LAB — CBC WITH DIFFERENTIAL/PLATELET
Basophils Absolute: 0 10*3/uL (ref 0.0–0.2)
Basos: 1 %
EOS (ABSOLUTE): 0 10*3/uL (ref 0.0–0.4)
Eos: 1 %
Hematocrit: 43.8 % (ref 34.0–46.6)
Hemoglobin: 14.3 g/dL (ref 11.1–15.9)
Immature Grans (Abs): 0 10*3/uL (ref 0.0–0.1)
Immature Granulocytes: 0 %
Lymphocytes Absolute: 1.1 10*3/uL (ref 0.7–3.1)
Lymphs: 20 %
MCH: 29.7 pg (ref 26.6–33.0)
MCHC: 32.6 g/dL (ref 31.5–35.7)
MCV: 91 fL (ref 79–97)
Monocytes Absolute: 0.4 10*3/uL (ref 0.1–0.9)
Monocytes: 7 %
Neutrophils Absolute: 4.1 10*3/uL (ref 1.4–7.0)
Neutrophils: 71 %
Platelets: 322 10*3/uL (ref 150–450)
RBC: 4.81 x10E6/uL (ref 3.77–5.28)
RDW: 12.2 % (ref 11.7–15.4)
WBC: 5.7 10*3/uL (ref 3.4–10.8)

## 2023-07-06 LAB — QUANTIFERON-TB GOLD PLUS
QuantiFERON Mitogen Value: >10 [IU]/mL
QuantiFERON Nil Value: 0.32 [IU]/mL
QuantiFERON TB1 Ag Value: 0.33 [IU]/mL
QuantiFERON TB2 Ag Value: 0.36 [IU]/mL
QuantiFERON-TB Gold Plus: NEGATIVE

## 2023-07-06 LAB — C-REACTIVE PROTEIN: CRP: <1 mg/L (ref 0–10)

## 2023-07-06 LAB — TSH: TSH: 0.557 u[IU]/mL (ref 0.450–4.500)

## 2023-07-06 LAB — HIV ANTIBODY (ROUTINE TESTING W REFLEX): HIV Screen 4th Generation wRfx: NONREACTIVE
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
## 2023-07-10 MED ORDER — HYDROCORTISONE ACETATE 25 MG RE SUPP: 25.0000 mg | Freq: Two times a day (BID) | RECTAL | 0 refills | Status: DC

## 2023-07-17 NOTE — Telephone Encounter (Signed)
 Attempted to reach patient but got no answer at the number provided in [facility] and it eventually led to busy signal. No voicemail.  Sent mychart message to patient.

## 2023-07-17 NOTE — Telephone Encounter (Signed)
===  View-only below this line=== ----- Message ----- FromBETHA Micki Izetta JONETTA Sent: 07/17/2023  10:26 AM EDT To: Nestor CHRISTELLA Blower, PA-C; Naomie LOISE Sharps, RN Subject: Peer-to-Peer                                   Good Morning, I have been tracking this case all week and today it says This request is being reviewed by our physicians- Sometimes this takes up to 3 days to get an answer about the authorization.  Do we want to reschedule her apt or do a peer-to-peer today? Her CT scan is early Monday am.  The number to call for PTP is [phone] Tracking Number [id number] Member ID Member ID:  [id number]  Thank you.

## 2023-07-20 ENCOUNTER — Ambulatory Visit (HOSPITAL_COMMUNITY)

## 2023-07-21 ENCOUNTER — Ambulatory Visit

## 2023-07-23 ENCOUNTER — Telehealth: Payer: Self-pay | Admitting: *Deleted

## 2023-07-23 DIAGNOSIS — K625 Hemorrhage of anus and rectum: Secondary | ICD-10-CM

## 2023-07-23 DIAGNOSIS — R1030 Lower abdominal pain, unspecified: Secondary | ICD-10-CM

## 2023-07-23 DIAGNOSIS — R140 Abdominal distension (gaseous): Secondary | ICD-10-CM

## 2023-07-23 DIAGNOSIS — R194 Change in bowel habit: Secondary | ICD-10-CM

## 2023-07-23 NOTE — Telephone Encounter (Signed)
 Left message for patient to call back.  Abdominal xray order placed for [facility].  CT abdomen/pelvis cancelled for 07/29/23.

## 2023-07-23 NOTE — Telephone Encounter (Signed)
 ----- Message from Elida CHRISTELLA Shawl sent at 07/23/2023  4:19 PM EDT ----- [place], peer to peer review completed, I spoke with Dr. Tobie and CTAP was denied despite diagnosis of abdominal pain, altered bowel pattern and rectal bleeding. Dr. Tobie stated the patient would have to undergo further imaging i.e. another abdominal x-ray even though 1 was done 12/2022 which he stated was outdated or an abdominal sonogram before CTAP could be authorized. I explained to Dr. Tobie and abdominal ultrasound and x-ray imaging will unlikely provide any guidance in the management of this patient's care. If the patient is willing, please send her for two-view abdominal x-ray early next week and send the results to her insurance carrier authorization department, they must receive by 08/06/2023, if received later a new authorization process will need to be initiated.  Please explain this ridiculous process to the patient and let her know based on [name]'s work up a diagnostic colonoscopy was recommended and I agree with her recommendations.   THX ----- Message ----- From: Claudene Naomie SAILOR, RN Sent: 07/23/2023   3:09 PM EDT To: Lynnie Bring, MD; Lbgi: Pod A App  Patient seen by [facility] 06/10/23 with continuing altered loose stools/formed stools, bloating, lower abdominal pain. [name] ordered CT. Insurance is requiring a peer to peer. This scan was originally scheduled for 07/20/23 and was moved to 07/29/23  to allow for insurance decision. Looks like we are going to have to reschedule her again.  Would one of you be able to help with peer to peer for this patient? ----- Message ----- From: Micki Izetta BIRCH Sent: 07/23/2023   1:20 PM EDT To: Nestor CHRISTELLA Blower, PA-C; Naomie SAILOR Claudene, RN  Hi Ladies-- Looks like a peer-to-peer needs to be done in order to obtain the [employer] [id number] Number to call for PTP is [phone]    PATIENT: Heidi Lozano PHYSICIAN: Dr. Nestor Blower MEMBER ID: [id number] FAX #:  [phone] HEALTH PLAN: [place] of [place] Medicaid RE: [id number] TEST REQUESTED: Abdomen and Pelvis CT [facility] [id number] CONFIDENTIAL NOTICE! If you received this facsimile in error, please reply immediately to the sender that you have received this message in error and destroy the original. This fax and any files transmitted with it contain information that may be legally confidential and/or privileged. The information is intended solely for the individual or entity named and access by anyone else is unauthorized. If you are not the intended recipient, any disclosure, copying, distribution or use of the contents of this information is prohibited and may be unlawful. The initial review of your request has been completed by our Physician Reviewer. The clinical information received does not support the requirements of medical necessity guidelines for this procedure. A preliminary review has been performed that is NOT a final determination. Requests for further information and a peer to peer discussion are being sent to the ordering physician. However, if no further information is received within the regulatory time frame, this determination will become final. We asked your provider to send us  more information to help us  approve your request. Your provider did not send us  the information.  On 07/23/2023, we asked your provider for the following important facts or documents: notes from your doctor that say that some other basic tests were done first (colonoscopy). The results of those tests should tell why this new test is needed.  Without this additional information, your request did not meet criteria for approval found in [employer] Clinical Guideline 068 for Abdomen Pelvis CT.  Please see the documentation needed which may allow us  to make a positive determination. Only sending daily notes may delay authorization. The ordering physician is responsible for obtaining prior authorizations and for  submitting the clinical records if requested. Please respond as soon as possible with the clinical information identified above. In addition, the Ordering Physician can schedule a peer-to-peer discussion with a Physician Reviewer or your representative with clinical information.

## 2023-07-27 NOTE — Telephone Encounter (Signed)
 I have spoken to patient to advise of insurance requirement despite our peer to peer request for CT abdomen/pelvis approval. Patient verbalizes understanding and states she will come by our radiology department within the next few days for abdominal xray. Advised that CT has been cancelled for now.

## 2023-07-27 NOTE — Telephone Encounter (Signed)
PT returning call / please advise

## 2023-07-27 NOTE — Telephone Encounter (Signed)
 Left message for patient to call back

## 2023-07-28 ENCOUNTER — Ambulatory Visit (INDEPENDENT_AMBULATORY_CARE_PROVIDER_SITE_OTHER)
Admission: RE | Admit: 2023-07-28 | Discharge: 2023-07-28 | Disposition: A | Discharge status: Home / Self Care | Source: Ambulatory Visit | Attending: Gastroenterology | Admitting: Gastroenterology

## 2023-07-28 ENCOUNTER — Ambulatory Visit (HOSPITAL_COMMUNITY)

## 2023-07-28 DIAGNOSIS — R194 Change in bowel habit: Secondary | ICD-10-CM | POA: Diagnosis not present

## 2023-07-28 DIAGNOSIS — R140 Abdominal distension (gaseous): Secondary | ICD-10-CM | POA: Diagnosis not present

## 2023-07-28 DIAGNOSIS — R1030 Lower abdominal pain, unspecified: Secondary | ICD-10-CM | POA: Diagnosis not present

## 2023-07-28 DIAGNOSIS — K625 Hemorrhage of anus and rectum: Secondary | ICD-10-CM | POA: Diagnosis not present

## 2023-07-29 ENCOUNTER — Ambulatory Visit: Payer: Self-pay | Admitting: Gastroenterology

## 2023-08-01 DIAGNOSIS — Z419 Encounter for procedure for purposes other than remedying health state, unspecified: Secondary | ICD-10-CM | POA: Diagnosis not present

## 2023-08-17 ENCOUNTER — Other Ambulatory Visit: Payer: Self-pay

## 2023-08-17 ENCOUNTER — Encounter: Payer: Self-pay | Admitting: Family Medicine

## 2023-08-17 MED ORDER — PANTOPRAZOLE SODIUM 40 MG PO TBEC: 40.0000 mg | DELAYED_RELEASE_TABLET | Freq: Two times a day (BID) | ORAL | 0 refills | Status: DC

## 2023-08-17 NOTE — Telephone Encounter (Signed)
 Spoke with patient and informed her of below and she is ok with trying the mentioned medication she would like that sent to [facility] drug.  She is also coming to see you next week to be evaluated and told her to call her GI office and request a visit with another provider.    Heidi Lozano, [place] to [facility] (Selected Message)     08/17/23 10:57 AM I can send in a 2 week course of a medication called Pantoprazole , which may help with the difficulty swallowing, if she is open to trying it in the meantime while we figure out what is going on.  Would she be able to be added to Dr. Adine schedule next week or the week after for a follow up. She already has one scheduled for 3 months, but his note read sooner PRN if symptoms persist.  She already sees GI for this, but says her provider is on maternity leave until October. They would be the ones to order an EGD and//or barium swallow for further imaging work up. I would also recommend she call her GI office and ask to see a different provider in the meantime while hers is on maternity leave.

## 2023-08-26 ENCOUNTER — Ambulatory Visit (INDEPENDENT_AMBULATORY_CARE_PROVIDER_SITE_OTHER)

## 2023-08-26 VITALS — BP 116/81 | HR 65 | Temp 98.2°F | Ht 65.0 in | Wt 162.0 lb

## 2023-08-26 DIAGNOSIS — K219 Gastro-esophageal reflux disease without esophagitis: Secondary | ICD-10-CM | POA: Diagnosis not present

## 2023-08-26 NOTE — Patient Instructions (Signed)
 VISIT SUMMARY: During your visit, we discussed your difficulty swallowing, throat discomfort, and irregular bowel movements. We have started treatment for gastroesophageal reflux disease (GERD) and provided recommendations for managing your symptoms.  YOUR PLAN: GASTROESOPHAGEAL REFLUX DISEASE (GERD): You have symptoms consistent with GERD, including difficulty swallowing, throat tightness, and discomfort, especially with solid foods. These symptoms are worse when lying down and in cold environments. -Start taking pantoprazole  40 mg once daily at night. If needed, you can increase the dose to twice daily. -Try this medication for two weeks and see if your symptoms improve. -You can use Tums as needed for any breakthrough symptoms. -Avoid dietary triggers such as acidic foods, greasy foods, and eating on an empty stomach. -Elevate the head of your bed to help reduce nighttime symptoms. -We provided you with educational materials on food triggers and lifestyle changes. -If your symptoms do not improve after two weeks, we may refer you to a gastroenterologist for a possible endoscopy.  If you have any problems before your next visit feel free to message me via MyChart (minor issues or questions) or call the office, otherwise you may reach out to schedule an office visit.  Thank you! Saddie Sacks, PA-C

## 2023-08-26 NOTE — Assessment & Plan Note (Signed)
 Symptoms consistent with GERD, including sensation of food sticking in the throat, tightness, and discomfort, especially with solid foods and when lying down. Differentials include esophageal stricture 2/2 GERD vs hiatal hernia. - Initiated pantoprazole  40 mg once daily at night, with option to increase to twice daily if needed. - Conduct a two-week trial of pantoprazole . - Advised use of Tums as needed for breakthrough symptoms. - Educated on dietary triggers such as acidic foods, greasy foods, and empty stomach exacerbating symptoms. - Recommended elevating the head of the bed to reduce nighttime symptoms. - Provided educational materials on food triggers and lifestyle modifications. - If symptoms persist after two weeks, consider referral to gastroenterology for possible endoscopy.

## 2023-08-26 NOTE — Progress Notes (Signed)
 Established Patient Office Visit  Subjective   Patient ID: Heidi Lozano, female    DOB: 01/24/1990  Age: 33 y.o. MRN: [id number]  Chief Complaint  Patient presents with   URI   Throat Issue    Onset: a few month ago    HPI   History of Present Illness Heidi Lozano is a 33 year old female who presents with difficulty swallowing and throat discomfort.  Dysphagia and throat discomfort - Difficulty swallowing, particularly with solid foods - Sensation of something being stuck in the throat - Throat tightness and discomfort, more pronounced with solids than liquids - Choking episodes when eating quickly - Intermittent sensation of throat constriction - Breathing feels odd at times, described as air touching something in the throat - No true airway restriction, but sensation is more noticeable in cold environments or when lying down - Symptoms in these situations exacerbate the feeling of not being able to breathe properly - Patient was prescribed protonix  40 mg last week, however she has not been able to pick it up from the pharmacy due to being out of town.   Dietary triggers and modifications - Diet consists of lean proteins, vegetables, and starches - No clear food triggers identified, but cherry tomatoes may exacerbate symptoms as she does eat a lot of these        ROS Per HPI.    Objective:     BP 116/81   Pulse 65   Temp 98.2 F (36.8 C) (Oral)   Ht 5' 5 (1.651 m)   Wt 162 lb (73.5 kg)   LMP 07/28/2023   SpO2 99%   BMI 26.96 kg/m    Physical Exam Constitutional:      General: She is not in acute distress.    Appearance: Normal appearance.  HENT:     Right Ear: Tympanic membrane normal.     Left Ear: Tympanic membrane normal.     Mouth/Throat:     Mouth: Mucous membranes are moist.     Pharynx: Oropharynx is clear.  Eyes:     Pupils: Pupils are equal, round, and reactive to light.  Cardiovascular:     Rate and Rhythm: Normal rate and regular  rhythm.     Heart sounds: Normal heart sounds. No murmur heard.    No friction rub. No gallop.  Pulmonary:     Effort: Pulmonary effort is normal. No respiratory distress.     Breath sounds: Normal breath sounds.  Abdominal:     General: Abdomen is flat. Bowel sounds are normal.     Palpations: Abdomen is soft.     Comments: Mild TTP to LLQ  Musculoskeletal:        General: No swelling.     Cervical back: Normal range of motion.  Lymphadenopathy:     Cervical: Cervical adenopathy present.  Skin:    General: Skin is warm and dry.  Neurological:     General: No focal deficit present.     Mental Status: She is alert.  Psychiatric:        Mood and Affect: Mood normal.        Behavior: Behavior normal.        Thought Content: Thought content normal.    No results found for any visits on 08/26/23.    The ASCVD Risk score (Arnett DK, et al., 2019) failed to calculate for the following reasons:   The 2019 ASCVD risk score is only valid for ages 7 to  79    Assessment & Plan:   Gastroesophageal reflux disease, unspecified whether esophagitis present Assessment & Plan: Symptoms consistent with GERD, including sensation of food sticking in the throat, tightness, and discomfort, especially with solid foods and when lying down. Differentials include esophageal stricture 2/2 GERD vs hiatal hernia. - Initiated pantoprazole  40 mg once daily at night, with option to increase to twice daily if needed. - Conduct a two-week trial of pantoprazole . - Advised use of Tums as needed for breakthrough symptoms. - Educated on dietary triggers such as acidic foods, greasy foods, and empty stomach exacerbating symptoms. - Recommended elevating the head of the bed to reduce nighttime symptoms. - Provided educational materials on food triggers and lifestyle modifications. - If symptoms persist after two weeks, consider referral to gastroenterology for possible endoscopy.    Return in about 4 months  (around 12/26/2023), or if symptoms worsen or fail to improve.    Saddie JULIANNA Sacks, PA-C

## 2023-09-01 DIAGNOSIS — Z419 Encounter for procedure for purposes other than remedying health state, unspecified: Secondary | ICD-10-CM | POA: Diagnosis not present

## 2023-09-23 ENCOUNTER — Other Ambulatory Visit: Payer: Self-pay | Admitting: Medical Genetics

## 2023-09-30 ENCOUNTER — Ambulatory Visit (INDEPENDENT_AMBULATORY_CARE_PROVIDER_SITE_OTHER): Admitting: Family Medicine

## 2023-09-30 ENCOUNTER — Encounter: Payer: Self-pay | Admitting: Family Medicine

## 2023-09-30 VITALS — BP 100/69 | HR 73 | Ht 65.0 in | Wt 163.0 lb

## 2023-09-30 DIAGNOSIS — R09A2 Foreign body sensation, throat: Secondary | ICD-10-CM

## 2023-09-30 DIAGNOSIS — R1319 Other dysphagia: Secondary | ICD-10-CM | POA: Diagnosis not present

## 2023-09-30 NOTE — Patient Instructions (Signed)
 It was nice to see you today,  We addressed the following topics today: -I am going to order a swallowing test.  If you have not heard from me regarding this test or from somebody to schedule it in 2 weeks let us  know - Based on the results of the test I we will either have you go back to the gastroenterologist or refer you to the ear nose and throat doctor. - You can stop taking your Protonix  now.  Have a great day,  Rolan Slain, MD

## 2023-09-30 NOTE — Progress Notes (Signed)
   Established Patient Office Visit  Subjective   Patient ID: Heidi Lozano, female    DOB: November 20, 1990  Age: 33 y.o. MRN: [id number]  Chief Complaint  Patient presents with   Medical Management of Chronic Issues    HPI  Subjective - Reports persistent dysphagia with a sensation of a lump in the throat. Describes feeling of food going down the wrong way, taking several seconds to pass. Also reports throat tenseness and a feeling of straining for air when sleeping. Symptoms have not improved.  Medications: Protonix , reports taking as prescribed without any relief from symptoms. Advised to discontinue.  PMH, PSH, FH, Social Hx: Previous evaluation by gastroenterology for diarrhea, constipation, and dysphagia; workup was reportedly limited to an X-ray and focused on bowel symptoms. Reports dietary modifications including avoiding carbonated beverages and alcohol.  ROS: HEENT: Positive for dysphagia and globus sensation. Negative for oropharyngeal phase difficulty. Respiratory: Reports sensation of straining for air when sleeping.   The ASCVD Risk score (Arnett DK, et al., 2019) failed to calculate for the following reasons:   The 2019 ASCVD risk score is only valid for ages 63 to 71  Health Maintenance Due  Topic Date Due   COVID-19 Vaccine (1) Never done   HPV VACCINES (2 - Risk 3-dose series) 07/27/2017      Objective:     BP 100/69   Pulse 73   Ht 5' 5 (1.651 m)   Wt 163 lb (73.9 kg)   LMP 09/20/2023   SpO2 99%   BMI 27.12 kg/m    Physical Exam Gen: alert, oriented Pulm: no resp distress Psych: pleasant affect   No results found for any visits on 09/30/23.      Assessment & Plan:   Sensation of lump in throat Assessment & Plan: - Persistent dysphagia and globus sensation despite a trial of Protonix . Prior evaluation by GI was reportedly inconclusive for this issue. Differential considerations include esophageal web, diverticulum, or stricture. - Order  barium swallow study. - If no scheduling call is received within 2 weeks, to message via MyChart. - If barium swallow is positive for a GI-related issue, will need to follow up with gastroenterology with new findings. - If barium swallow is negative or non-diagnostic, plan for referral to ENT. - Discontinue Protonix .    Orders: -     SLP modified barium swallow; Future  Esophageal dysphagia -     SLP modified barium swallow; Future     Return in about 3 months (around 12/30/2023) for dysphagia.    Toribio MARLA Slain, MD

## 2023-10-02 DIAGNOSIS — Z419 Encounter for procedure for purposes other than remedying health state, unspecified: Secondary | ICD-10-CM | POA: Diagnosis not present

## 2023-10-03 NOTE — Assessment & Plan Note (Signed)
-   Persistent dysphagia and globus sensation despite a trial of Protonix . Prior evaluation by GI was reportedly inconclusive for this issue. Differential considerations include esophageal web, diverticulum, or stricture. - Order barium swallow study. - If no scheduling call is received within 2 weeks, to message via MyChart. - If barium swallow is positive for a GI-related issue, will need to follow up with gastroenterology with new findings. - If barium swallow is negative or non-diagnostic, plan for referral to ENT. - Discontinue Protonix .

## 2023-10-05 ENCOUNTER — Other Ambulatory Visit (HOSPITAL_COMMUNITY): Payer: Self-pay | Admitting: *Deleted

## 2023-10-05 ENCOUNTER — Telehealth (HOSPITAL_COMMUNITY): Payer: Self-pay | Admitting: *Deleted

## 2023-10-05 DIAGNOSIS — R1310 Dysphagia, unspecified: Secondary | ICD-10-CM

## 2023-10-05 NOTE — Telephone Encounter (Signed)
 Attempted to contact patient to schedule OP MBS. Left VM @ [phone]. RKEEL

## 2023-10-12 ENCOUNTER — Ambulatory Visit (HOSPITAL_COMMUNITY)
Admission: RE | Admit: 2023-10-12 | Discharge: 2023-10-12 | Disposition: A | Discharge status: Home / Self Care | Source: Ambulatory Visit | Attending: Family Medicine | Admitting: Family Medicine

## 2023-10-12 DIAGNOSIS — K219 Gastro-esophageal reflux disease without esophagitis: Secondary | ICD-10-CM | POA: Diagnosis not present

## 2023-10-12 DIAGNOSIS — R1319 Other dysphagia: Secondary | ICD-10-CM | POA: Insufficient documentation

## 2023-10-12 DIAGNOSIS — R09A2 Foreign body sensation, throat: Secondary | ICD-10-CM | POA: Diagnosis not present

## 2023-10-12 DIAGNOSIS — R1310 Dysphagia, unspecified: Secondary | ICD-10-CM

## 2023-10-12 NOTE — Evaluation (Signed)
 Modified Barium Swallow Study  Patient Details  Name: Heidi Lozano MRN: [id number] Date of Birth: 10-11-1990  Today's Date: 10/12/2023  Modified Barium Swallow completed.  Full report located under Chart Review in the Imaging Section.  History of Present Illness Heidi Lozano is a 33 y.o. female who was referred for an OP MBS by Dr. Toribio Slain.  She reports persistent sensation of a lump in the throat, globus immediately and sometimes hours after eating. She has made diet modifications in the last three months, avoids acidic foods, has cut back on caffeine. She does not drink alcohol.  Reports increased throat-clearing and phlegm production. Saw GI 08/26/23, dx with GERD and prescribed Protonix .  She describes no real success (It appears she was instructed to stop taking this med without weaning, potentially leading to rebound acid hypersecretion.) Oral mechanism exam is normal. Voice is of normal volume and quality.   Clinical Impression Heidi Lozano presents with a normal oropharyngeal swallow with good oral and pharyngeal transfer, reliable laryngeal closure, and no pharyngeal residue post-swallow.  Intermittent esophageal sweeps revealed barium retention.  A 13 mm barium pill lodged mid-esophagus and ultimately required liquid wash to transfer through.  We discussed study in real time and afterwards.  Pt needs esophageal w/u with GI to r/o dysmotility vs stricture.  She was given education re: low acid diet.  Pt will f/u with her PCP. No further SLP recs. Factors that may increase risk of adverse event in presence of aspiration Heidi Lozano 2021):    Swallow Evaluation Recommendations Recommendations: PO diet PO Diet Recommendation: Regular;Thin liquids (Level 0) Liquid Administration via: Straw;Cup Medication Administration: Whole meds with liquid Supervision: Patient able to self-feed Postural changes: Position pt fully upright for meals;Stay upright 30-60 min after meals Recommended  consults: Consider GI consultation;Consider esophageal assessment     Heidi Sculley L. Vona, MA CCC/SLP Clinical Specialist - Acute Care SLP Acute Rehabilitation Services Office number [phone]  Heidi Lozano 10/12/2023,1:27 PM

## 2023-10-13 ENCOUNTER — Telehealth: Payer: Self-pay

## 2023-10-13 ENCOUNTER — Ambulatory Visit: Payer: Self-pay | Admitting: Family Medicine

## 2023-10-13 NOTE — Telephone Encounter (Signed)
 Copied from CRM #[id number]. Topic: Clinical - Lab/Test Results >> Oct 13, 2023 12:06 PM Fonda T wrote: Reason for CRM: Received call from patient, states she is returning call to office to Sorayah Schrodt to review results from swallow study.  Patient can be  reached at [phone], to discuss further.  Patient is aware of same day call back.

## 2023-10-13 NOTE — Progress Notes (Signed)
 Called pt she is advised of her results and recommendation

## 2023-10-13 NOTE — Progress Notes (Signed)
 Called patient LVM to contact the office if pt calls please advised

## 2023-11-05 ENCOUNTER — Other Ambulatory Visit

## 2023-11-05 DIAGNOSIS — Z006 Encounter for examination for normal comparison and control in clinical research program: Secondary | ICD-10-CM

## 2023-11-13 LAB — GENECONNECT MOLECULAR SCREEN: Genetic Analysis Overall Interpretation: NEGATIVE

## 2023-11-16 ENCOUNTER — Emergency Department (HOSPITAL_COMMUNITY)

## 2023-11-16 ENCOUNTER — Ambulatory Visit: Payer: Self-pay

## 2023-11-16 ENCOUNTER — Encounter (HOSPITAL_COMMUNITY): Payer: Self-pay | Admitting: *Deleted

## 2023-11-16 ENCOUNTER — Other Ambulatory Visit: Payer: Self-pay

## 2023-11-16 ENCOUNTER — Emergency Department (HOSPITAL_COMMUNITY)
Admission: EM | Admit: 2023-11-16 | Discharge: 2023-11-16 | Disposition: A | Discharge status: Home / Self Care | Source: Ambulatory Visit | Attending: Emergency Medicine | Admitting: Emergency Medicine

## 2023-11-16 DIAGNOSIS — R079 Chest pain, unspecified: Secondary | ICD-10-CM | POA: Diagnosis not present

## 2023-11-16 DIAGNOSIS — R0602 Shortness of breath: Secondary | ICD-10-CM | POA: Diagnosis not present

## 2023-11-16 DIAGNOSIS — R0789 Other chest pain: Secondary | ICD-10-CM | POA: Diagnosis not present

## 2023-11-16 DIAGNOSIS — R0600 Dyspnea, unspecified: Secondary | ICD-10-CM

## 2023-11-16 LAB — TROPONIN I (HIGH SENSITIVITY)
Troponin I (High Sensitivity): 4 ng/L (ref <18)
Troponin I (High Sensitivity): <2 ng/L (ref <18)

## 2023-11-16 LAB — BASIC METABOLIC PANEL WITH GFR
Anion gap: 10 (ref 5–15)
BUN: 10 mg/dL (ref 6–20)
CO2: 23 mmol/L (ref 22–32)
Calcium: 9 mg/dL (ref 8.9–10.3)
Chloride: 106 mmol/L (ref 98–111)
Creatinine, Ser: 0.84 mg/dL (ref 0.44–1.00)
GFR, Estimated: >60 mL/min (ref >60)
Glucose, Bld: 93 mg/dL (ref 70–99)
Potassium: 3.8 mmol/L (ref 3.5–5.1)
Sodium: 139 mmol/L (ref 135–145)

## 2023-11-16 LAB — CBC WITH DIFFERENTIAL/PLATELET
Abs Immature Granulocytes: 0.02 K/uL (ref 0.00–0.07)
Basophils Absolute: 0.1 K/uL (ref 0.0–0.1)
Basophils Relative: 1 %
Eosinophils Absolute: 0.2 K/uL (ref 0.0–0.5)
Eosinophils Relative: 4 %
HCT: 41.7 % (ref 36.0–46.0)
Hemoglobin: 14.2 g/dL (ref 12.0–15.0)
Immature Granulocytes: 0 %
Lymphocytes Relative: 36 %
Lymphs Abs: 2.3 K/uL (ref 0.7–4.0)
MCH: 29.1 pg (ref 26.0–34.0)
MCHC: 34.1 g/dL (ref 30.0–36.0)
MCV: 85.5 fL (ref 80.0–100.0)
Monocytes Absolute: 0.4 K/uL (ref 0.1–1.0)
Monocytes Relative: 6 %
Neutro Abs: 3.4 K/uL (ref 1.7–7.7)
Neutrophils Relative %: 53 %
Platelets: 399 K/uL (ref 150–400)
RBC: 4.88 MIL/uL (ref 3.87–5.11)
RDW: 12.2 % (ref 11.5–15.5)
WBC: 6.3 K/uL (ref 4.0–10.5)
nRBC: 0 % (ref 0.0–0.2)

## 2023-11-16 MED ORDER — IPRATROPIUM BROMIDE 0.02 % IN SOLN
0.5000 mg | Freq: Once | RESPIRATORY_TRACT | Status: AC
  Administered 2023-11-16: 0.5 mg via RESPIRATORY_TRACT
  Filled 2023-11-16: qty 2.5

## 2023-11-16 MED ORDER — IOHEXOL 350 MG/ML SOLN
75.0000 mL | Freq: Once | INTRAVENOUS | Status: AC | PRN
  Administered 2023-11-16: 75 mL via INTRAVENOUS

## 2023-11-16 MED ORDER — ALPRAZOLAM 0.25 MG PO TABS: 0.2500 mg | ORAL_TABLET | Freq: Three times a day (TID) | ORAL | 0 refills | Status: AC | PRN

## 2023-11-16 MED ORDER — ALBUTEROL SULFATE HFA 108 (90 BASE) MCG/ACT IN AERS
2.0000 | INHALATION_SPRAY | Freq: Once | RESPIRATORY_TRACT | Status: AC
  Administered 2023-11-16: 2 via RESPIRATORY_TRACT
  Filled 2023-11-16: qty 6.7

## 2023-11-16 MED ORDER — ALBUTEROL SULFATE (2.5 MG/3ML) 0.083% IN NEBU
5.0000 mg | INHALATION_SOLUTION | Freq: Once | RESPIRATORY_TRACT | Status: AC
  Administered 2023-11-16: 5 mg via RESPIRATORY_TRACT
  Filled 2023-11-16: qty 6

## 2023-11-16 MED ORDER — LORAZEPAM 2 MG/ML IJ SOLN
1.0000 mg | Freq: Once | INTRAMUSCULAR | Status: AC
Start: 2023-11-16 — End: 2023-11-16
  Administered 2023-11-16: 1 mg via INTRAVENOUS
  Filled 2023-11-16: qty 1

## 2023-11-16 NOTE — ED Notes (Signed)
 Patient requesting to speak with EDP regarding concerning symptoms prior to DC. Previously assigned EDP not here at this time, message sent to [facility] DO.

## 2023-11-16 NOTE — ED Provider Triage Note (Signed)
 Emergency Medicine Provider Triage Evaluation Note  [name] , a 33 y.o. female  was evaluated in triage.  Pt complains of chest pain and shortness of breath. Thinks it may be anxiety but has been going on for a month.   Review of Systems  Positive: Chest pain, sob Negative: Nausea, lightheadedness  Physical Exam  BP (!) 141/77 (BP Location: Right Arm)   Pulse 65   Temp 98 F (36.7 C) (Oral)   Resp 20   Ht 5' 5 (1.651 m)   Wt 72.6 kg   SpO2 96%   BMI 26.63 kg/m  Gen:   Awake, no distress   Resp:  Normal effort  MSK:   Moves extremities without difficulty    Medical Decision Making  Medically screening exam initiated at 11:21 AM.  Appropriate orders placed.  Heidi Lozano was informed that the remainder of the evaluation will be completed by another provider, this initial triage assessment does not replace that evaluation, and the importance of remaining in the ED until their evaluation is complete.     Heidi Duwaine CROME, DO 11/16/23 1122

## 2023-11-16 NOTE — ED Triage Notes (Signed)
 C/o  chest pressure onset several weeks ago , states she woke up sob this am, co tightness and pressure for a long time.
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
## 2023-11-16 NOTE — ED Provider Notes (Signed)
 [place] EMERGENCY DEPARTMENT AT [facility] Provider Note   CSN: [id number] Arrival date & time: 11/16/23  1027     Patient presents with: Chest Pain   Heidi Lozano is a 33 y.o. female.   33 year old female presents with shortness of breath for about a month worse the last several weeks.  States that it is worse at night when she lies flat.  Some exertional component to it.  States that it is relieved when she uses an albuterol inhaler.  Denies any fever cough congestion.  No leg pain or swelling.  No prior history of DVT or PE.  Does have history of anxiety but feels that this is different.  No leg pain or swelling.  No prior history of COPD.  Called her doctor and told to come here       Prior to Admission medications   Medication Sig Start Date End Date Taking? Authorizing Provider  acetaminophen  (TYLENOL ) 325 MG tablet Take 325 mg by mouth daily as needed (pain).    [provider]  dicyclomine  (BENTYL ) 10 MG capsule Take 1 capsule (10 mg total) by mouth 3 (three) times daily before meals. 06/10/23   McMichael, Nestor HERO, PA-C  hydrocortisone  (ANUSOL -HC) 25 MG suppository Place 1 suppository (25 mg total) rectally 2 (two) times daily. X 14 days 07/10/23   Mollie Nestor M, PA-C  ibuprofen  (ADVIL ) 600 MG tablet Take 1 tablet (600 mg total) by mouth every 6 (six) hours. 09/18/21   Meisinger, Krystal, MD  pantoprazole  (PROTONIX ) 40 MG tablet Take 1 tablet (40 mg total) by mouth 2 (two) times daily. 08/17/23   Gayle Saddie FALCON, PA-C    Allergies: Coconut flavoring agent (non-screening)    Review of Systems  All other systems reviewed and are negative.   Updated Vital Signs BP (!) 141/77 (BP Location: Right Arm)   Pulse 65   Temp 98 F (36.7 C) (Oral)   Resp 20   Ht 1.651 m (5' 5)   Wt 72.6 kg   SpO2 96%   BMI 26.63 kg/m   Physical Exam Vitals and nursing note reviewed.  Constitutional:      General: She is not in acute distress.    Appearance: Normal  appearance. She is well-developed. She is not toxic-appearing.  HENT:     Head: Normocephalic and atraumatic.  Eyes:     General: Lids are normal.     Conjunctiva/sclera: Conjunctivae normal.     Pupils: Pupils are equal, round, and reactive to light.  Neck:     Thyroid : No thyroid  mass.     Trachea: No tracheal deviation.  Cardiovascular:     Rate and Rhythm: Normal rate and regular rhythm.     Heart sounds: Normal heart sounds. No murmur heard.    No gallop.  Pulmonary:     Effort: Pulmonary effort is normal. No respiratory distress.     Breath sounds: Normal breath sounds. No stridor. No decreased breath sounds, wheezing, rhonchi or rales.  Abdominal:     General: There is no distension.     Palpations: Abdomen is soft.     Tenderness: There is no abdominal tenderness. There is no rebound.  Musculoskeletal:        General: No tenderness. Normal range of motion.     Cervical back: Normal range of motion and neck supple.  Skin:    General: Skin is warm and dry.     Findings: No abrasion or rash.  Neurological:  Mental Status: She is alert and oriented to person, place, and time. Mental status is at baseline.     GCS: GCS eye subscore is 4. GCS verbal subscore is 5. GCS motor subscore is 6.     Cranial Nerves: No cranial nerve deficit.     Sensory: No sensory deficit.     Motor: Motor function is intact.  Psychiatric:        Attention and Perception: Attention normal.        Mood and Affect: Mood is anxious.        Speech: Speech normal.        Behavior: Behavior normal.     (all labs ordered are listed, but only abnormal results are displayed) Labs Reviewed  CBC WITH DIFFERENTIAL/PLATELET  BASIC METABOLIC PANEL WITH GFR  TROPONIN I (HIGH SENSITIVITY)    EKG: EKG Interpretation Date/Time:  Monday November 16 2023 10:45:51 EDT Ventricular Rate:  72 PR Interval:  156 QRS Duration:  90 QT Interval:  392 QTC Calculation: 429 R Axis:   87  Text  Interpretation: Normal sinus rhythm Normal ECG  Compared with prior EKG from 03/25/2010 Confirmed by Gennaro Bouchard ([id number]) on 11/16/2023 10:53:44 AM  Radiology: [facility] Chest 1 View Result Date: 11/16/2023 CLINICAL DATA:  Chest pain and pressure for several weeks. Shortness of breath this morning with prolonged chest tightness and pressure. EXAM: DG CHEST 1V COMPARISON:  03/25/2010 FINDINGS: The heart size and mediastinal contours are within normal limits. Both lungs are clear. The visualized skeletal structures are unremarkable. IMPRESSION: No active disease. Electronically Signed   By: Elspeth Bathe M.D.   On: 11/16/2023 11:58     Procedures   Medications Ordered in the ED  albuterol (PROVENTIL) (2.5 MG/3ML) 0.083% nebulizer solution 5 mg (has no administration in time range)  ipratropium (ATROVENT) nebulizer solution 0.5 mg (has no administration in time range)                                    Medical Decision Making Amount and/or Complexity of Data Reviewed Radiology: ordered.  Risk Prescription drug management.   Patient's EKG shows sinus rhythm.  Chest x-ray without acute findings.  Patient states albuterol helped her earlier and I gave her that.  Also gave Ativan which she states made her feel much better.  Suspicion for PE and show CT angio chest which was negative.  Cardiac enzymes negative.  Patient has had the symptoms for now over several weeks.  Low suspicion for ACS.  Suspect some element of anxiety.  Will prescribe short course of Xanax and she will follow-up with her doctor.     Final diagnoses:  None    ED Discharge Orders     None          Dasie Faden, MD 11/16/23 1542

## 2023-11-16 NOTE — ED Notes (Signed)
 Pt very anxious and tearful at this time, expressing concerns of anxiety for CT and IV start. Secure chat sent to EDP regarding order for medication for anxiety, as requested by patient.

## 2023-11-16 NOTE — Discharge Instructions (Addendum)
 Call your doctor to schedule a follow-up visit for later this week.  Return here for any severe trouble breathing

## 2023-11-17 ENCOUNTER — Ambulatory Visit: Admitting: Physician Assistant

## 2023-11-17 ENCOUNTER — Encounter: Payer: Self-pay | Admitting: Physician Assistant

## 2023-11-17 VITALS — BP 94/60 | HR 81 | Ht 65.0 in | Wt 161.2 lb

## 2023-11-17 DIAGNOSIS — R140 Abdominal distension (gaseous): Secondary | ICD-10-CM

## 2023-11-17 DIAGNOSIS — K649 Unspecified hemorrhoids: Secondary | ICD-10-CM | POA: Diagnosis not present

## 2023-11-17 DIAGNOSIS — K219 Gastro-esophageal reflux disease without esophagitis: Secondary | ICD-10-CM

## 2023-11-17 DIAGNOSIS — R1319 Other dysphagia: Secondary | ICD-10-CM | POA: Diagnosis not present

## 2023-11-17 DIAGNOSIS — R194 Change in bowel habit: Secondary | ICD-10-CM

## 2023-11-17 MED ORDER — OMEPRAZOLE 40 MG PO CPDR: 40.0000 mg | DELAYED_RELEASE_CAPSULE | Freq: Every day | ORAL | 0 refills | Status: DC

## 2023-11-17 NOTE — Patient Instructions (Signed)
 _______________________________________________________  If your blood pressure at your visit was 140/90 or greater, please contact your primary care physician to follow up on this.  _______________________________________________________  If you are age 31 or older, your body mass index should be between 23-30. Your Body mass index is 26.83 kg/m. If this is out of the aforementioned range listed, please consider follow up with your Primary Care Provider.  If you are age 73 or younger, your body mass index should be between 19-25. Your Body mass index is 26.83 kg/m. If this is out of the aformentioned range listed, please consider follow up with your Primary Care Provider.   ________________________________________________________  The [facility] providers would like to encourage you to use MYCHART to communicate with providers for non-urgent requests or questions.  Due to long hold times on the telephone, sending your provider a message by [facility] may be a faster and more efficient way to get a response.  Please allow 48 business hours for a response.  Please remember that this is for non-urgent requests.  _______________________________________________________  [facility] is using a team-based approach to care.  Your team is made up of your doctor and two to three APPS. Our APPS (Nurse Practitioners and Physician Assistants) work with your physician to ensure care continuity for you. They are fully qualified to address your health concerns and develop a treatment plan. They communicate directly with your gastroenterologist to care for you. Seeing the Advanced Practice Practitioners on your physician's team can help you by facilitating care more promptly, often allowing for earlier appointments, access to diagnostic testing, procedures, and other specialty referrals.   You have been scheduled for a Barium Esophogram at [facility] (1st floor of the hospital) on 11-30-23 at  11am. Please arrive 30 minutes prior to your appointment for registration. Make certain not to have anything to eat or drink 3 hours prior to your test. If you need to reschedule for any reason, please contact radiology at [phone] to do so. __________________________________________________________________ A barium swallow is an examination that concentrates on views of the esophagus. This tends to be a double contrast exam (barium and two liquids which, when combined, create a gas to distend the wall of the oesophagus) or single contrast (non-ionic iodine based). The study is usually tailored to your symptoms so a good history is essential. Attention is paid during the study to the form, structure and configuration of the esophagus, looking for functional disorders (such as aspiration, dysphagia, achalasia, motility and reflux) EXAMINATION You may be asked to change into a gown, depending on the type of swallow being performed. A radiologist and radiographer will perform the procedure. The radiologist will advise you of the type of contrast selected for your procedure and direct you during the exam. You will be asked to stand, sit or lie in several different positions and to hold a small amount of fluid in your mouth before being asked to swallow while the imaging is performed .In some instances you may be asked to swallow barium coated marshmallows to assess the motility of a solid food bolus. The exam can be recorded as a digital or video fluoroscopy procedure. POST PROCEDURE It will take 1-2 days for the barium to pass through your system. To facilitate this, it is important, unless otherwise directed, to increase your fluids for the next 24-48hrs and to resume your normal diet.  This test typically takes about 30 minutes to perform. __________________________________________________________________________________  Heidi Lozano have been scheduled for an appointment with  Alan Coombs PA-C on 12-15-23  at 840am . Please arrive 10 minutes early for your appointment.  Please come back to the basement of our building and get labs that are in epic.  You do not need an appointment.  Our office is located at [facility] office at [address] [place], [place] [address].  You can come anytime to the basement of our building  Monday through friday between the hours of 7:30 am and 4:00 pm to have labs drawn.   We are ordering a Diatherix stool testing for you to take home and complete.  You have received a kit from our office today containing all necessary supplies to complete this test.  Please carefully read the stool collection instructions provided in the kit before opening the accompanying materials  OR an easier way is to please use toilet paper to wipe after your bowel movement and use the qtip/applicator provided to get a small volume of the stool from the toilet paper and place that in the tube.   Important to remember: -Place the label onto the puritan opti-swab TUBE. -This label should include your full name and date of birth.  - This label should have the DATE AND TIME stool was collects -After completing the test, you should secure the tube into the specimen biohazard bag.  -The laboratory request information sheet (including date and time of specimen collection) should be placed into the outside pocket of the specimen biohazard bag and returned to the [facility] lab with 2 days of collection.   If it is greater than two days from collection you will be asked to repeat the test.  If the label is missing from the tube with your name, date of birth, date and time of collection on it, you will have to repeat the test.  Any questions please message us  on my chart or call the office at [phone]   Silent reflux: Not all heartburn burns...SABRASABRASABRA  Reflux Gourmet Rescue  It is an ALGINATE THERAPY which is the only intervention that works to safeguard the esophagus by  creating a protective barrier that actually stops reflux from happening. -The general directions for use are as stated on the packaging: Take 1 teaspoon (5 ml), or more as needed or as directed by your physician, after meals and before bed. -These general directions address the most common times for reflux to occur, but our Rescue products may be taken anytime. Some individuals may take our product preemptively, when they know they will suffer from reflux, or as needed - when discomfort arises. (If taken around food, it should be consumed last.) -You do not have to take 1 teaspoon (5 ml) of the product. While one teaspoon (5ml) may be the perfect average amount to relieve reflux suffering in some, others may require more or less. You may adjust the amount of Mint Chocolate Rescue and Vanilla Caramel Rescue to the lowest amount necessary to meet your individual needs to improve your quality of life. -You may dilute the product if it is too viscous for you to consume. Keep in mind, however, that the thickness of the product was formulated to provide optimal coating and protection of your throat and esophagus. Though diluting the product is possible, it may reduce the protective function and/or length of action. -This can be used in conjunction with reflux medications and lifestyle changes.  100% ALL-NATURAL  Paraben FREE, glycerin  FREE, & potassium FREE  Made entirely from all-natural ingredients considered safe for  children and during pregnancy  No known side effects  All-natural flavor Gluten FREE  Allergen FREE  Vegan  Can find more information here: nameseizer.co.nz   What is LPR? Laryngopharyngeal reflux (LPR) or silent reflux is a condition in which acid that is made in the stomach travels up the esophagus (swallowing tube) and gets to the throat. Not everyone with reflux has a lot of heartburn or indigestion. In fact, many people with LPR never have  heartburn. This is why LPR is called SILENT REFLUX, and the terms Silent reflux and LPR are often used interchangeably. Because LPR is silent, it is sometimes difficult to diagnose.  How can you tell if you have LPR?  Chronic hoarseness- Some people have hoarseness that comes and goes throat clearing  Cough It can cause shortness of breath and cause asthma like symptoms. a feeling of a lump in the throat  difficulty swallowing a problem with too much nose and throat drainage.  Some people will feel their esophagus spasm which feels like their heart beating hard and fast, this will usually be after a meal, at rest, or lying down at night.    How do I treat this? Treatment for LPR should be individualized, and your doctor will suggest the best treatment for you. Generally there are several treatments for LPR: changing habits and diet to reduce reflux,  medications to reduce stomach acid, and  surgery to prevent reflux. Most people with LPR need to modify how and when they eat, as well as take some medication, to get well. Sometimes, nonprescription liquid antacids, such as Maalox, Gelucil and Mylanta are recommended. When used, these antacids should be taken four times each day - one tablespoon one hour after each meal and before bedtime. Dietary and lifestyle changes alone are not often enough to control LPR - medications that reduce stomach acid are also usually needed. These must be prescribed by our doctor.   TIPS FOR REDUCING REFLUX AND LPR Control your LIFE-STYLE and your DIET! If you use tobacco, QUIT.  Smoking makes you reflux. After every cigarette you have some LPR.  Don't wear clothing that is too tight, especially around the waist (trousers, corsets, belts).  Do not lie down just after eating...in fact, do not eat within three hours of bedtime.  You should be on a low-fat diet.  Limit your intake of red meat.  Limit your intake of butter.  Avoid fried foods.  Avoid  chocolate  Avoid cheese.  Avoid eggs. Specifically avoid caffeine (especially coffee and tea), soda pop (especially cola) and mints.  Avoid alcoholic beverages, particularly in the evening.    First do a trial off milk/lactose products if you use them.  Add fiber like benefiber or citracel once a day Increase activity Can do trial of IBGard which is over the counter for AB pain- Take 1-2 capsules once a day for maintence or twice a day during a flare     FODMAP stands for fermentable oligo-, di-, mono-saccharides and polyols (1). These are the scientific terms used to classify groups of carbs that are difficult for our body to digest and that are notorious for triggering digestive symptoms like bloating, gas, loose stools and stomach pain.   You can try low FODMAP diet  - start with eliminating just one column at a time that you feel may be a trigger for you. - the table at the very bottom contains foods that are low in FODMAPs   Sometimes trying to eliminate  the FODMAP's from your diet is difficult or tricky, if you are stuggling with trying to do the elimination diet you can try an enzyme.  There is a food enzymes that you sprinkle in or on your food that helps break down the FODMAP. You can read more about the enzyme by going to this site: https://fodzyme.com/   Here some information about pelvic floor dysfunction. This may be contributing to some of your symptoms. We will continue with our evaluation but I do want you to consider adding on fiber supplement with low-dose MiraLAX daily. We could also refer to pelvic floor physical therapy.   Pelvic Floor Dysfunction, Female Pelvic floor dysfunction (PFD) is a condition that results when the group of muscles and connective tissues that support the organs in the pelvis (pelvic floor muscles) do not work well. These muscles and their connections form a sling that supports the colon and bladder. In women, they also support the  uterus. PFD causes pelvic floor muscles to be too weak, too tight, or both. In PFD, muscle movements are not coordinated. This may cause bowel or bladder problems. It may also cause pain. What are the causes? This condition may be caused by an injury to the pelvic area or by a weakening of pelvic muscles. This often results from pregnancy and childbirth or other types of strain. In many cases, the exact cause is not known. What increases the risk? The following factors may make you more likely to develop this condition: Having chronic bladder tissue inflammation (interstitial cystitis). Being an older person. Being overweight. History of radiation treatment for cancer in the pelvic region. Previous pelvic surgery, such as removal of the uterus (hysterectomy). What are the signs or symptoms? Symptoms of this condition vary and may include: Bladder symptoms, such as: Trouble starting urination and emptying the bladder. Frequent urinary tract infections. Leaking urine when coughing, laughing, or exercising (stress incontinence). Having to pass urine urgently or frequently. Pain when passing urine. Bowel symptoms, such as: Constipation. Urgent or frequent bowel movements. Incomplete bowel movements. Painful bowel movements. Leaking stool or gas. Unexplained genital or rectal pain. Genital or rectal muscle spasms. Low back pain. Other symptoms may include: A heavy, full, or aching feeling in the vagina. A bulge that protrudes into the vagina. Pain during or after sex. How is this diagnosed? This condition may be diagnosed based on: Your symptoms and medical history. A physical exam. During the exam, your health care provider may check your pelvic muscles for tightness, spasm, pain, or weakness. This may include a rectal exam and a pelvic exam. In some cases, you may have diagnostic tests, such as: Electrical muscle function tests. Urine flow testing. X-ray tests of bowel  function. Ultrasound of the pelvic organs. How is this treated? Treatment for this condition depends on the symptoms. Treatment options include: Physical therapy. This may include Kegel exercises to help relax or strengthen the pelvic floor muscles. Biofeedback. This type of therapy provides feedback on how tight your pelvic floor muscles are so that you can learn to control them. Internal or external massage therapy. A treatment that involves electrical stimulation of the pelvic floor muscles to help control pain (transcutaneous electrical nerve stimulation, or TENS). Sound wave therapy (ultrasound) to reduce muscle spasms. Medicines, such as: Muscle relaxants. Bladder control medicines. Surgery to reconstruct or support pelvic floor muscles may be an option if other treatments do not help. Follow these instructions at home: Activity Do your usual activities as told by your  health care provider. Ask your health care provider if you should modify any activities. Do pelvic floor strengthening or relaxing exercises at home as told by your physical therapist. Lifestyle Maintain a healthy weight. Eat foods that are high in fiber, such as beans, whole grains, and fresh fruits and vegetables. Limit foods that are high in fat and processed sugars, such as fried or sweet foods. Manage stress with relaxation techniques such as yoga or meditation. General instructions If you have problems with leakage: Use absorbable pads or wear padded underwear. Wash frequently with mild soap. Keep your genital and anal area as clean and dry as possible. Ask your health care provider if you should try a barrier cream to prevent skin irritation. Take warm baths to relieve pelvic muscle tension or spasms. Take over-the-counter and prescription medicines only as told by your health care provider. Keep all follow-up visits. How is this prevented? The cause of PFD is not always known, but there are a few things  you can do to reduce the risk of developing this condition, including: Staying at a healthy weight. Getting regular exercise. Managing stress. Contact a health care provider if: Your symptoms are not improving with home care. You have signs or symptoms of PFD that get worse at home. You develop new signs or symptoms. You have signs of a urinary tract infection, such as: Fever. Chills. Increased urinary frequency. A burning feeling when urinating. You have not had a bowel movement in 3 days (constipation). Summary Pelvic floor dysfunction results when the muscles and connective tissues in your pelvic floor do not work well. These muscles and their connections form a sling that supports your colon and bladder. In women, they also support the uterus. PFD may be caused by an injury to the pelvic area or by a weakening of pelvic muscles. PFD causes pelvic floor muscles to be too weak, too tight, or a combination of both. Symptoms may vary from person to person. In most cases, PFD can be treated with physical therapies and medicines. Surgery may be an option if other treatments do not help. This information is not intended to replace advice given to you by your health care provider. Make sure you discuss any questions you have with your health care provider. Document Revised: 05/16/2020 Document Reviewed: 05/16/2020 Elsevier Patient Education  2022 [employer].

## 2023-11-18 ENCOUNTER — Other Ambulatory Visit

## 2023-11-18 DIAGNOSIS — R194 Change in bowel habit: Secondary | ICD-10-CM

## 2023-11-18 DIAGNOSIS — R140 Abdominal distension (gaseous): Secondary | ICD-10-CM | POA: Diagnosis not present

## 2023-11-18 DIAGNOSIS — R1319 Other dysphagia: Secondary | ICD-10-CM | POA: Diagnosis not present

## 2023-11-24 ENCOUNTER — Telehealth: Payer: Self-pay | Admitting: Physician Assistant

## 2023-11-24 DIAGNOSIS — Z0000 Encounter for general adult medical examination without abnormal findings: Secondary | ICD-10-CM | POA: Diagnosis not present

## 2023-11-24 NOTE — Telephone Encounter (Signed)
 Diatherix H. pylori negative

## 2023-11-30 ENCOUNTER — Ambulatory Visit: Payer: Self-pay | Admitting: Physician Assistant

## 2023-11-30 ENCOUNTER — Ambulatory Visit (HOSPITAL_COMMUNITY)
Admission: RE | Admit: 2023-11-30 | Discharge: 2023-11-30 | Disposition: A | Discharge status: Home / Self Care | Source: Ambulatory Visit | Attending: Physician Assistant | Admitting: Physician Assistant

## 2023-11-30 DIAGNOSIS — R194 Change in bowel habit: Secondary | ICD-10-CM | POA: Insufficient documentation

## 2023-11-30 DIAGNOSIS — R1310 Dysphagia, unspecified: Secondary | ICD-10-CM | POA: Diagnosis not present

## 2023-11-30 DIAGNOSIS — R1319 Other dysphagia: Secondary | ICD-10-CM | POA: Diagnosis not present

## 2023-11-30 DIAGNOSIS — R140 Abdominal distension (gaseous): Secondary | ICD-10-CM | POA: Insufficient documentation

## 2023-11-30 DIAGNOSIS — K219 Gastro-esophageal reflux disease without esophagitis: Secondary | ICD-10-CM | POA: Diagnosis not present

## 2023-12-02 DIAGNOSIS — Z419 Encounter for procedure for purposes other than remedying health state, unspecified: Secondary | ICD-10-CM | POA: Diagnosis not present

## 2023-12-08 ENCOUNTER — Encounter: Payer: Self-pay | Admitting: Physician Assistant
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
## 2023-12-15 ENCOUNTER — Telehealth: Payer: Self-pay | Admitting: *Deleted

## 2023-12-15 ENCOUNTER — Ambulatory Visit: Payer: Self-pay | Admitting: Physician Assistant

## 2023-12-15 ENCOUNTER — Other Ambulatory Visit (INDEPENDENT_AMBULATORY_CARE_PROVIDER_SITE_OTHER)

## 2023-12-15 ENCOUNTER — Ambulatory Visit (INDEPENDENT_AMBULATORY_CARE_PROVIDER_SITE_OTHER): Admitting: Physician Assistant

## 2023-12-15 ENCOUNTER — Encounter: Payer: Self-pay | Admitting: Physician Assistant

## 2023-12-15 VITALS — BP 104/68 | HR 76 | Ht 65.0 in | Wt 168.5 lb

## 2023-12-15 DIAGNOSIS — K219 Gastro-esophageal reflux disease without esophagitis: Secondary | ICD-10-CM

## 2023-12-15 DIAGNOSIS — R194 Change in bowel habit: Secondary | ICD-10-CM

## 2023-12-15 DIAGNOSIS — K625 Hemorrhage of anus and rectum: Secondary | ICD-10-CM | POA: Diagnosis not present

## 2023-12-15 DIAGNOSIS — R1030 Lower abdominal pain, unspecified: Secondary | ICD-10-CM

## 2023-12-15 DIAGNOSIS — R140 Abdominal distension (gaseous): Secondary | ICD-10-CM

## 2023-12-15 DIAGNOSIS — K649 Unspecified hemorrhoids: Secondary | ICD-10-CM

## 2023-12-15 DIAGNOSIS — R7989 Other specified abnormal findings of blood chemistry: Secondary | ICD-10-CM

## 2023-12-15 LAB — HEPATIC FUNCTION PANEL
ALT: 42 U/L — ABNORMAL HIGH (ref 0–35)
AST: 27 U/L (ref 0–37)
Albumin: 4.4 g/dL (ref 3.5–5.2)
Alkaline Phosphatase: 61 U/L (ref 39–117)
Bilirubin, Direct: 0.1 mg/dL (ref 0.0–0.3)
Total Bilirubin: 0.4 mg/dL (ref 0.2–1.2)
Total Protein: 7.2 g/dL (ref 6.0–8.3)

## 2023-12-15 LAB — HIGH SENSITIVITY CRP: CRP, High Sensitivity: 0.41 mg/L (ref 0.000–5.000)

## 2023-12-15 LAB — TSH: TSH: 1.71 u[IU]/mL (ref 0.35–5.50)

## 2023-12-15 LAB — SEDIMENTATION RATE: Sed Rate: 4 mm/h (ref 0–20)

## 2023-12-15 MED ORDER — OMEPRAZOLE 40 MG PO CPDR: 40.0000 mg | DELAYED_RELEASE_CAPSULE | Freq: Every day | ORAL | 0 refills | Status: AC

## 2023-12-15 MED ORDER — HYDROCORTISONE (PERIANAL) 2.5 % EX CREA: 1.0000 | TOPICAL_CREAM | Freq: Two times a day (BID) | CUTANEOUS | 2 refills | Status: AC

## 2023-12-15 MED ORDER — HYDROCORTISONE ACETATE 25 MG RE SUPP
25.0000 mg | Freq: Two times a day (BID) | RECTAL | 0 refills | Status: AC
Start: 2023-12-15 — End: ?

## 2023-12-15 NOTE — Telephone Encounter (Signed)
 LVM for pt to call office to discuss upcoming appts. Please transfer her to office if she calls back.

## 2023-12-15 NOTE — Telephone Encounter (Signed)
 RUQ US  order in epic. Secure staff message sent to radiology scheduling to contact patient to schedule appt.

## 2023-12-15 NOTE — Patient Instructions (Addendum)
 Your provider has requested that you go to the basement level for lab work before leaving today. Press B on the elevator. The lab is located at the first door on the left as you exit the elevator.  FIBER SUPPLEMENT You can do metamucil or fibercon once or twice a day but if this causes gas/bloating please switch to Benefiber or Citracel.  Fiber is good for constipation/diarrhea/irritable bowel syndrome.  It can also help with weight loss and can help lower your bad cholesterol (LDL).  Please do 1 TBSP in the morning in water, coffee, or tea.  It can take up to a month before you can see a difference with your bowel movements.  It is cheapest from [employer], [employer], [employer].   Toileting tips to help with your constipation - Drink at least 64-80 ounces of water/liquid per day. - Establish a time to try to move your bowels every day.  For many people, this is after a cup of coffee or after a meal such as breakfast. - Sit all of the way back on the toilet keeping your back fairly straight and while sitting up, try to rest the tops of your forearms on your upper thighs.   - Raising your feet with a step stool/squatty potty can be helpful to improve the angle that allows your stool to pass through the rectum. - Relax the rectum feeling it bulge toward the toilet water.  If you feel your rectum raising toward your body, you are contracting rather than relaxing. - Breathe in and slowly exhale. Belly breath by expanding your belly towards your belly button. Keep belly expanded as you gently direct pressure down and back to the anus.  A low pitched GRRR sound can assist with increasing intra-abdominal pressure.  (Can also trying to blow on a pinwheel and make it move, this helps with the same belly breathing) - Repeat 3-4 times. If unsuccessful, contract the pelvic floor to restore normal tone and get off the toilet.  Avoid excessive straining. - To reduce excessive wiping by teaching your anus to normally  contract, place hands on outer aspect of knees and resist knee movement outward.  Hold 5-10 second then place hands just inside of knees and resist inward movement of knees.  Hold 5 seconds.  Repeat a few times each way.  Go to the ER if unable to pass gas, severe AB pain, unable to hold down food, any shortness of breath of chest pain.  If the hemorrhoid suppository sent in is too expensive you can do this over the counter trick.  Apply a pea size amount of generic prescription Anusol  HC cream that has been sent into your pharmacy to the tip of an over the counter PrepH suppository and insert rectally once every night for at least 7 nights.     Here some information about pelvic floor dysfunction. This may be contributing to some of your symptoms. We will continue with our evaluation but I do want you to consider adding on fiber supplement with low-dose MiraLAX daily. We could also refer to pelvic floor physical therapy.   Pelvic Floor Dysfunction, Female Pelvic floor dysfunction (PFD) is a condition that results when the group of muscles and connective tissues that support the organs in the pelvis (pelvic floor muscles) do not work well. These muscles and their connections form a sling that supports the colon and bladder. In women, they also support the uterus. PFD causes pelvic floor muscles to be too weak, too tight,  or both. In PFD, muscle movements are not coordinated. This may cause bowel or bladder problems. It may also cause pain. What are the causes? This condition may be caused by an injury to the pelvic area or by a weakening of pelvic muscles. This often results from pregnancy and childbirth or other types of strain. In many cases, the exact cause is not known. What increases the risk? The following factors may make you more likely to develop this condition: Having chronic bladder tissue inflammation (interstitial cystitis). Being an older person. Being overweight. History of  radiation treatment for cancer in the pelvic region. Previous pelvic surgery, such as removal of the uterus (hysterectomy). What are the signs or symptoms? Symptoms of this condition vary and may include: Bladder symptoms, such as: Trouble starting urination and emptying the bladder. Frequent urinary tract infections. Leaking urine when coughing, laughing, or exercising (stress incontinence). Having to pass urine urgently or frequently. Pain when passing urine. Bowel symptoms, such as: Constipation. Urgent or frequent bowel movements. Incomplete bowel movements. Painful bowel movements. Leaking stool or gas. Unexplained genital or rectal pain. Genital or rectal muscle spasms. Low back pain. Other symptoms may include: A heavy, full, or aching feeling in the vagina. A bulge that protrudes into the vagina. Pain during or after sex. How is this diagnosed? This condition may be diagnosed based on: Your symptoms and medical history. A physical exam. During the exam, your health care provider may check your pelvic muscles for tightness, spasm, pain, or weakness. This may include a rectal exam and a pelvic exam. In some cases, you may have diagnostic tests, such as: Electrical muscle function tests. Urine flow testing. X-ray tests of bowel function. Ultrasound of the pelvic organs. How is this treated? Treatment for this condition depends on the symptoms. Treatment options include: Physical therapy. This may include Kegel exercises to help relax or strengthen the pelvic floor muscles. Biofeedback. This type of therapy provides feedback on how tight your pelvic floor muscles are so that you can learn to control them. Internal or external massage therapy. A treatment that involves electrical stimulation of the pelvic floor muscles to help control pain (transcutaneous electrical nerve stimulation, or TENS). Sound wave therapy (ultrasound) to reduce muscle spasms. Medicines, such  as: Muscle relaxants. Bladder control medicines. Surgery to reconstruct or support pelvic floor muscles may be an option if other treatments do not help. Follow these instructions at home: Activity Do your usual activities as told by your health care provider. Ask your health care provider if you should modify any activities. Do pelvic floor strengthening or relaxing exercises at home as told by your physical therapist. Lifestyle Maintain a healthy weight. Eat foods that are high in fiber, such as beans, whole grains, and fresh fruits and vegetables. Limit foods that are high in fat and processed sugars, such as fried or sweet foods. Manage stress with relaxation techniques such as yoga or meditation. General instructions If you have problems with leakage: Use absorbable pads or wear padded underwear. Wash frequently with mild soap. Keep your genital and anal area as clean and dry as possible. Ask your health care provider if you should try a barrier cream to prevent skin irritation. Take warm baths to relieve pelvic muscle tension or spasms. Take over-the-counter and prescription medicines only as told by your health care provider. Keep all follow-up visits. How is this prevented? The cause of PFD is not always known, but there are a few things you can do to reduce  the risk of developing this condition, including: Staying at a healthy weight. Getting regular exercise. Managing stress. Contact a health care provider if: Your symptoms are not improving with home care. You have signs or symptoms of PFD that get worse at home. You develop new signs or symptoms. You have signs of a urinary tract infection, such as: Fever. Chills. Increased urinary frequency. A burning feeling when urinating. You have not had a bowel movement in 3 days (constipation). Summary Pelvic floor dysfunction results when the muscles and connective tissues in your pelvic floor do not work well. These muscles  and their connections form a sling that supports your colon and bladder. In women, they also support the uterus. PFD may be caused by an injury to the pelvic area or by a weakening of pelvic muscles. PFD causes pelvic floor muscles to be too weak, too tight, or a combination of both. Symptoms may vary from person to person. In most cases, PFD can be treated with physical therapies and medicines. Surgery may be an option if other treatments do not help. This information is not intended to replace advice given to you by your health care provider. Make sure you discuss any questions you have with your health care provider. Document Revised: 05/16/2020 Document Reviewed: 05/16/2020 Elsevier Patient Education  2022 [employer].  Due to recent changes in healthcare laws, you may see the results of your imaging and laboratory studies on MyChart before your provider has had a chance to review them.  We understand that in some cases there may be results that are confusing or concerning to you. Not all laboratory results come back in the same time frame and the provider may be waiting for multiple results in order to interpret others.  Please give us  48 hours in order for your provider to thoroughly review all the results before contacting the office for clarification of your results.

## 2023-12-16 ENCOUNTER — Ambulatory Visit (HOSPITAL_COMMUNITY)
Admission: RE | Admit: 2023-12-16 | Discharge: 2023-12-16 | Disposition: A | Discharge status: Home / Self Care | Source: Ambulatory Visit | Attending: Physician Assistant | Admitting: Physician Assistant

## 2023-12-16 DIAGNOSIS — R7989 Other specified abnormal findings of blood chemistry: Secondary | ICD-10-CM | POA: Diagnosis not present

## 2023-12-21 ENCOUNTER — Other Ambulatory Visit

## 2023-12-23 ENCOUNTER — Ambulatory Visit: Payer: Self-pay | Admitting: Physician Assistant

## 2023-12-28 ENCOUNTER — Ambulatory Visit: Admitting: Family Medicine

## 2023-12-30 ENCOUNTER — Ambulatory Visit: Admitting: Family Medicine

## 2024-02-15 ENCOUNTER — Ambulatory Visit: Admitting: Physician Assistant

## 2024-03-02 ENCOUNTER — Ambulatory Visit: Admitting: Physician Assistant
